# Patient Record
Sex: Male | Born: 1960
Health system: Southern US, Community
[De-identification: ages and names within clinical notes are randomized; demographics above are authoritative.]

## PROBLEM LIST (undated history)

## (undated) DIAGNOSIS — K219 Gastro-esophageal reflux disease without esophagitis: Secondary | ICD-10-CM

## (undated) DIAGNOSIS — N200 Calculus of kidney: Secondary | ICD-10-CM

## (undated) DIAGNOSIS — G4730 Sleep apnea, unspecified: Secondary | ICD-10-CM

## (undated) DIAGNOSIS — S83519A Sprain of anterior cruciate ligament of unspecified knee, initial encounter: Secondary | ICD-10-CM

## (undated) DIAGNOSIS — E039 Hypothyroidism, unspecified: Secondary | ICD-10-CM

## (undated) DIAGNOSIS — F1010 Alcohol abuse, uncomplicated: Secondary | ICD-10-CM

## (undated) DIAGNOSIS — F329 Major depressive disorder, single episode, unspecified: Secondary | ICD-10-CM

## (undated) DIAGNOSIS — E785 Hyperlipidemia, unspecified: Secondary | ICD-10-CM

## (undated) DIAGNOSIS — H269 Unspecified cataract: Secondary | ICD-10-CM

## (undated) DIAGNOSIS — F32A Depression, unspecified: Secondary | ICD-10-CM

## (undated) DIAGNOSIS — F1910 Other psychoactive substance abuse, uncomplicated: Secondary | ICD-10-CM

## (undated) DIAGNOSIS — M1990 Unspecified osteoarthritis, unspecified site: Secondary | ICD-10-CM

## (undated) DIAGNOSIS — J449 Chronic obstructive pulmonary disease, unspecified: Secondary | ICD-10-CM

## (undated) HISTORY — DX: Depression, unspecified: F32.A

## (undated) HISTORY — DX: Sprain of anterior cruciate ligament of unspecified knee, initial encounter: S83.519A

## (undated) HISTORY — DX: Chronic obstructive pulmonary disease, unspecified: J44.9

## (undated) HISTORY — PX: FINGER SURGERY: SHX640

## (undated) HISTORY — DX: Alcohol abuse, uncomplicated: F10.10

## (undated) HISTORY — PX: NASAL SINUS SURGERY: SHX719

## (undated) HISTORY — DX: Calculus of kidney: N20.0

## (undated) HISTORY — DX: Major depressive disorder, single episode, unspecified: F32.9

## (undated) HISTORY — DX: Hypothyroidism, unspecified: E03.9

## (undated) HISTORY — DX: Unspecified cataract: H26.9

## (undated) HISTORY — DX: Gastro-esophageal reflux disease without esophagitis: K21.9

## (undated) HISTORY — DX: Other psychoactive substance abuse, uncomplicated: F19.10

## (undated) HISTORY — DX: Unspecified osteoarthritis, unspecified site: M19.90

## (undated) HISTORY — DX: Hyperlipidemia, unspecified: E78.5

## (undated) HISTORY — DX: Sleep apnea, unspecified: G47.30

---

## 1999-01-12 ENCOUNTER — Ambulatory Visit (HOSPITAL_BASED_OUTPATIENT_CLINIC_OR_DEPARTMENT_OTHER): Admission: RE | Admit: 1999-01-12 | Discharge: 1999-01-12 | Payer: Self-pay | Admitting: Otolaryngology

## 2001-05-10 ENCOUNTER — Emergency Department (HOSPITAL_COMMUNITY): Admission: EM | Admit: 2001-05-10 | Discharge: 2001-05-10 | Payer: Self-pay

## 2004-10-26 ENCOUNTER — Ambulatory Visit: Payer: Self-pay | Admitting: Internal Medicine

## 2004-12-02 ENCOUNTER — Ambulatory Visit: Payer: Self-pay | Admitting: Internal Medicine

## 2005-02-01 ENCOUNTER — Ambulatory Visit: Payer: Self-pay | Admitting: Internal Medicine

## 2005-06-09 ENCOUNTER — Ambulatory Visit: Payer: Self-pay | Admitting: Internal Medicine

## 2005-06-14 ENCOUNTER — Ambulatory Visit: Payer: Self-pay | Admitting: Internal Medicine

## 2005-12-22 ENCOUNTER — Ambulatory Visit: Payer: Self-pay | Admitting: Internal Medicine

## 2006-12-25 ENCOUNTER — Ambulatory Visit: Payer: Self-pay | Admitting: Internal Medicine

## 2006-12-25 LAB — CONVERTED CEMR LAB
Albumin: 4 g/dL (ref 3.5–5.2)
Alkaline Phosphatase: 57 units/L (ref 39–117)
Basophils Absolute: 0 10*3/uL (ref 0.0–0.1)
Basophils Relative: 0.1 % (ref 0.0–1.0)
Bilirubin Urine: NEGATIVE
Cholesterol: 211 mg/dL (ref 0–200)
Eosinophils Relative: 1.7 % (ref 0.0–5.0)
GFR calc non Af Amer: 86 mL/min
Glucose, Bld: 100 mg/dL — ABNORMAL HIGH (ref 70–99)
HCT: 40.2 % (ref 39.0–52.0)
Ketones, ur: NEGATIVE mg/dL
MCHC: 35.5 g/dL (ref 30.0–36.0)
Monocytes Absolute: 0.8 10*3/uL — ABNORMAL HIGH (ref 0.2–0.7)
Neutro Abs: 6.7 10*3/uL (ref 1.4–7.7)
Neutrophils Relative %: 73.6 % (ref 43.0–77.0)
Potassium: 4.2 meq/L (ref 3.5–5.1)
Sodium: 139 meq/L (ref 135–145)
TSH: 3.7 microintl units/mL (ref 0.35–5.50)
Total Bilirubin: 0.5 mg/dL (ref 0.3–1.2)
Triglycerides: 193 mg/dL — ABNORMAL HIGH (ref 0–149)
Urine Glucose: NEGATIVE mg/dL
Urobilinogen, UA: 0.2 (ref 0.0–1.0)
VLDL: 39 mg/dL (ref 0–40)
WBC: 9.2 10*3/uL (ref 4.5–10.5)

## 2007-01-07 ENCOUNTER — Ambulatory Visit: Payer: Self-pay

## 2007-01-28 ENCOUNTER — Ambulatory Visit: Payer: Self-pay | Admitting: Cardiology

## 2007-01-28 LAB — CONVERTED CEMR LAB
BUN: 12 mg/dL (ref 6–23)
Basophils Relative: 0.9 % (ref 0.0–1.0)
CO2: 28 meq/L (ref 19–32)
Calcium: 9.2 mg/dL (ref 8.4–10.5)
Chloride: 107 meq/L (ref 96–112)
Eosinophils Absolute: 0.1 10*3/uL (ref 0.0–0.6)
Eosinophils Relative: 1.5 % (ref 0.0–5.0)
GFR calc Af Amer: 118 mL/min
GFR calc non Af Amer: 97 mL/min
HCT: 40 % (ref 39.0–52.0)
INR: 0.9 (ref 0.8–1.0)
Lymphocytes Relative: 19 % (ref 12.0–46.0)
Potassium: 3.9 meq/L (ref 3.5–5.1)
Prothrombin Time: 11.4 s (ref 10.9–13.3)
RBC: 4.34 M/uL (ref 4.22–5.81)

## 2007-02-01 ENCOUNTER — Ambulatory Visit: Payer: Self-pay | Admitting: Cardiology

## 2007-02-01 ENCOUNTER — Inpatient Hospital Stay (HOSPITAL_BASED_OUTPATIENT_CLINIC_OR_DEPARTMENT_OTHER): Admission: RE | Admit: 2007-02-01 | Discharge: 2007-02-01 | Payer: Self-pay | Admitting: Cardiology

## 2007-02-19 ENCOUNTER — Ambulatory Visit: Payer: Self-pay | Admitting: Cardiology

## 2008-04-15 ENCOUNTER — Telehealth: Payer: Self-pay | Admitting: Internal Medicine

## 2008-05-29 ENCOUNTER — Ambulatory Visit: Payer: Self-pay | Admitting: Internal Medicine

## 2008-05-29 DIAGNOSIS — E785 Hyperlipidemia, unspecified: Secondary | ICD-10-CM | POA: Insufficient documentation

## 2008-05-29 DIAGNOSIS — E039 Hypothyroidism, unspecified: Secondary | ICD-10-CM | POA: Insufficient documentation

## 2008-05-29 DIAGNOSIS — M1990 Unspecified osteoarthritis, unspecified site: Secondary | ICD-10-CM | POA: Insufficient documentation

## 2008-05-29 DIAGNOSIS — F4323 Adjustment disorder with mixed anxiety and depressed mood: Secondary | ICD-10-CM | POA: Insufficient documentation

## 2008-08-06 ENCOUNTER — Ambulatory Visit: Payer: Self-pay | Admitting: Internal Medicine

## 2008-08-06 LAB — CONVERTED CEMR LAB
AST: 18 units/L (ref 0–37)
Alkaline Phosphatase: 62 units/L (ref 39–117)
Basophils Absolute: 0 10*3/uL (ref 0.0–0.1)
Basophils Relative: 0.6 % (ref 0.0–3.0)
Bilirubin Urine: NEGATIVE
Bilirubin, Direct: 0.1 mg/dL (ref 0.0–0.3)
CO2: 31 meq/L (ref 19–32)
Calcium: 9.7 mg/dL (ref 8.4–10.5)
Chloride: 102 meq/L (ref 96–112)
Eosinophils Absolute: 0.2 10*3/uL (ref 0.0–0.7)
Eosinophils Relative: 2.1 % (ref 0.0–5.0)
HCT: 43.1 % (ref 39.0–52.0)
HDL: 46.4 mg/dL (ref >39.00)
Hemoglobin, Urine: NEGATIVE
Ketones, ur: NEGATIVE mg/dL
Leukocytes, UA: NEGATIVE
Lymphs Abs: 1.4 10*3/uL (ref 0.7–4.0)
MCHC: 33.7 g/dL (ref 30.0–36.0)
MCV: 92.6 fL (ref 78.0–100.0)
Neutrophils Relative %: 67.9 % (ref 43.0–77.0)
Nitrite: NEGATIVE
Potassium: 4.3 meq/L (ref 3.5–5.1)
RBC: 4.66 M/uL (ref 4.22–5.81)
Sodium: 140 meq/L (ref 135–145)
Total Protein: 7.1 g/dL (ref 6.0–8.3)
Urobilinogen, UA: 0.2 (ref 0.0–1.0)
VLDL: 26.4 mg/dL (ref 0.0–40.0)
pH: 7 (ref 5.0–8.0)

## 2008-08-12 ENCOUNTER — Ambulatory Visit: Payer: Self-pay | Admitting: Internal Medicine

## 2008-11-23 ENCOUNTER — Telehealth: Payer: Self-pay | Admitting: Internal Medicine

## 2008-11-23 ENCOUNTER — Ambulatory Visit (HOSPITAL_COMMUNITY): Admission: RE | Admit: 2008-11-23 | Discharge: 2008-11-23 | Payer: Self-pay | Admitting: Internal Medicine

## 2008-11-23 ENCOUNTER — Ambulatory Visit: Payer: Self-pay | Admitting: Internal Medicine

## 2008-11-23 DIAGNOSIS — J439 Emphysema, unspecified: Secondary | ICD-10-CM | POA: Insufficient documentation

## 2008-11-23 DIAGNOSIS — R079 Chest pain, unspecified: Secondary | ICD-10-CM | POA: Insufficient documentation

## 2008-11-23 DIAGNOSIS — R930 Abnormal findings on diagnostic imaging of skull and head, not elsewhere classified: Secondary | ICD-10-CM | POA: Insufficient documentation

## 2008-12-03 ENCOUNTER — Ambulatory Visit: Payer: Self-pay | Admitting: Internal Medicine

## 2009-03-10 ENCOUNTER — Ambulatory Visit: Payer: Self-pay | Admitting: Internal Medicine

## 2009-03-10 LAB — CONVERTED CEMR LAB
ALT: 27 units/L (ref 0–53)
Albumin: 4.1 g/dL (ref 3.5–5.2)
BUN: 7 mg/dL (ref 6–23)
CO2: 29 meq/L (ref 19–32)
Calcium: 9.5 mg/dL (ref 8.4–10.5)
Cholesterol: 190 mg/dL (ref 0–200)
Creatinine, Ser: 1 mg/dL (ref 0.4–1.5)
LDL Cholesterol: 118 mg/dL — ABNORMAL HIGH (ref 0–99)
Sodium: 138 meq/L (ref 135–145)
Total Bilirubin: 0.8 mg/dL (ref 0.3–1.2)
Total CHOL/HDL Ratio: 5
Total Protein: 7.3 g/dL (ref 6.0–8.3)
Triglycerides: 151 mg/dL — ABNORMAL HIGH (ref 0.0–149.0)

## 2009-03-15 ENCOUNTER — Ambulatory Visit: Payer: Self-pay | Admitting: Internal Medicine

## 2009-03-15 DIAGNOSIS — M545 Low back pain, unspecified: Secondary | ICD-10-CM | POA: Insufficient documentation

## 2009-03-15 DIAGNOSIS — N200 Calculus of kidney: Secondary | ICD-10-CM | POA: Insufficient documentation

## 2009-03-15 LAB — CONVERTED CEMR LAB
Bilirubin Urine: NEGATIVE
Ketones, ur: NEGATIVE mg/dL
Leukocytes, UA: NEGATIVE
Nitrite: NEGATIVE
Urine Glucose: NEGATIVE mg/dL

## 2009-04-07 ENCOUNTER — Telehealth: Payer: Self-pay | Admitting: Internal Medicine

## 2009-05-12 ENCOUNTER — Ambulatory Visit: Payer: Self-pay | Admitting: Family Medicine

## 2009-05-12 DIAGNOSIS — J019 Acute sinusitis, unspecified: Secondary | ICD-10-CM | POA: Insufficient documentation

## 2009-05-31 ENCOUNTER — Telehealth: Payer: Self-pay | Admitting: Internal Medicine

## 2009-06-03 ENCOUNTER — Ambulatory Visit: Payer: Self-pay | Admitting: Cardiology

## 2009-06-22 ENCOUNTER — Telehealth: Payer: Self-pay | Admitting: Internal Medicine

## 2009-08-30 ENCOUNTER — Ambulatory Visit: Payer: Self-pay | Admitting: Internal Medicine

## 2009-08-30 LAB — CONVERTED CEMR LAB
ALT: 28 units/L (ref 0–53)
AST: 28 units/L (ref 0–37)
Alkaline Phosphatase: 59 units/L (ref 39–117)
BUN: 6 mg/dL (ref 6–23)
Basophils Absolute: 0 10*3/uL (ref 0.0–0.1)
Basophils Relative: 0.4 % (ref 0.0–3.0)
Bilirubin Urine: NEGATIVE
CO2: 31 meq/L (ref 19–32)
Calcium: 9.4 mg/dL (ref 8.4–10.5)
Creatinine, Ser: 1 mg/dL (ref 0.4–1.5)
HCT: 38.6 % — ABNORMAL LOW (ref 39.0–52.0)
Hemoglobin: 13.4 g/dL (ref 13.0–17.0)
Ketones, ur: NEGATIVE mg/dL
Lymphocytes Relative: 14.1 % (ref 12.0–46.0)
Lymphs Abs: 1.1 10*3/uL (ref 0.7–4.0)
Neutrophils Relative %: 74.4 % (ref 43.0–77.0)
RBC: 4.15 M/uL — ABNORMAL LOW (ref 4.22–5.81)
Specific Gravity, Urine: 1.015 (ref 1.000–1.030)
Total Bilirubin: 0.4 mg/dL (ref 0.3–1.2)
Total CHOL/HDL Ratio: 4
Total Protein, Urine: NEGATIVE mg/dL
Triglycerides: 111 mg/dL (ref 0.0–149.0)
Urine Glucose: NEGATIVE mg/dL
WBC: 8.1 10*3/uL (ref 4.5–10.5)
pH: 7.5 (ref 5.0–8.0)

## 2009-09-06 ENCOUNTER — Ambulatory Visit: Payer: Self-pay | Admitting: Internal Medicine

## 2009-11-29 ENCOUNTER — Ambulatory Visit: Payer: Self-pay | Admitting: Internal Medicine

## 2010-02-11 ENCOUNTER — Ambulatory Visit: Payer: Self-pay | Admitting: Emergency Medicine

## 2010-02-11 DIAGNOSIS — L923 Foreign body granuloma of the skin and subcutaneous tissue: Secondary | ICD-10-CM | POA: Insufficient documentation

## 2010-03-02 ENCOUNTER — Ambulatory Visit: Payer: Self-pay | Admitting: Internal Medicine

## 2010-03-03 LAB — CONVERTED CEMR LAB
BUN: 11 mg/dL (ref 6–23)
CO2: 32 meq/L (ref 19–32)
Calcium: 9.5 mg/dL (ref 8.4–10.5)
Creatinine, Ser: 1 mg/dL (ref 0.4–1.5)
Glucose, Bld: 96 mg/dL (ref 70–99)
HDL: 42.3 mg/dL (ref >39.00)
Sodium: 139 meq/L (ref 135–145)
TSH: 6.51 microintl units/mL — ABNORMAL HIGH (ref 0.35–5.50)
Total CHOL/HDL Ratio: 4

## 2010-03-11 ENCOUNTER — Ambulatory Visit: Payer: Self-pay | Admitting: Internal Medicine

## 2010-03-11 DIAGNOSIS — J069 Acute upper respiratory infection, unspecified: Secondary | ICD-10-CM | POA: Insufficient documentation

## 2010-05-12 ENCOUNTER — Ambulatory Visit: Admit: 2010-05-12 | Payer: Self-pay | Admitting: Internal Medicine

## 2010-06-02 NOTE — Assessment & Plan Note (Signed)
Summary: CPX / NWS  #   Vital Signs:  Patient profile:   50 year old male Height:      74 inches Weight:      189 pounds BMI:     24.35 O2 Sat:      97 % on Room air Temp:     98.2 degrees F oral Pulse rate:   95 / minute BP sitting:   124 / 88  (left arm) Cuff size:   regular  Vitals Entered By: Lucious Groves (Sep 06, 2009 8:37 AM)  O2 Flow:  Room air CC: CPX./kb Is Patient Diabetic? No Pain Assessment Patient in pain? no        Primary Care Provider:  Tresa Garter MD  CC:  CPX./kb.  History of Present Illness: The patient presents for a wellness examination   Current Medications (verified): 1)  Wellbutrin Xl 300 Mg Xr24h-Tab (Bupropion Hcl) .... Once Daily 2)  Vitamin D3 1000 Unit  Tabs (Cholecalciferol) .Marland Kitchen.. 1 By Mouth Daily 3)  Simvastatin 40 Mg Tabs (Simvastatin) .... Take One Tablet Qd 4)  Mobic 15 Mg Tabs (Meloxicam) .... Once Daily 5)  Synthroid 100 Mcg Tabs (Levothyroxine Sodium) .Marland Kitchen.. 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         allergies  Physical Exam  General:  alert, well-developed, well-nourished, and cooperative to examination.    Head:  Normocephalic and atraumatic without obvious abnormalities. No apparent alopecia or balding. Eyes:  No corneal or conjunctival inflammation noted. EOMI. Perrla.  Ears:  External ear exam shows no significant lesions or deformities.  Otoscopic examination B wax, tympanic membranes are intact bilaterally without bulging, retraction, inflammation or discharge. Hearing is grossly normal bilaterally. Nose:  External nasal examination shows no deformity or inflammation. Nasal mucosa are pink and moist without lesions or exudates. Mouth:  Oral mucosa and oropharynx without lesions or exudates.  Teeth in good repair. Neck:  supple, full ROM, no masses, no JVD, no HJR, and no neck tenderness.   Lungs:  Normal respiratory effort, chest expands symmetrically. Lungs are clear to auscultation, no crackles or wheezes. Heart:  normal rate, regular rhythm, no murmur, no gallop, no rub, no heaves, and PMI normal.   Abdomen:  Bowel sounds positive,abdomen soft and non-tender without masses, organomegaly or hernias noted. Rectal:  No external abnormalities noted. Normal sphincter tone. No rectal masses or tenderness. Genitalia:  Testes bilaterally descended without nodularity, tenderness or masses. No scrotal masses or lesions. No penis lesions or urethral discharge. Prostate:  1+ enlarged.   Msk:  LS NT Pulses:  R and L carotid,radial,femoral,dorsalis pedis and posterior tibial pulses are full and equal bilaterally Extremities:  No clubbing, cyanosis, edema, or deformity noted with normal full range of motion  of all joints.   Neurologic:  No cranial nerve deficits noted. Station and gait are normal. Plantar reflexes are down-going bilaterally. DTRs are symmetrical throughout. Sensory, motor and coordinative functions appear intact. Skin:  Intact without  suspicious lesions or rashes Cervical Nodes:  No lymphadenopathy noted Inguinal Nodes:  No significant adenopathy Psych:  Cognition and judgment appear intact. Alert and cooperative with normal attention span and concentration. No apparent delusions, illusions, hallucinations   Impression & Recommendations:  Problem # 1:  PHYSICAL EXAMINATION (ICD-V70.0) Assessment New The labs were reviewed with the patient.  Health and age related issues were discussed. Available screening tests and vaccinations were discussed as well. Healthy life style including good diet and execise was discussed.   Problem # 2:  CIGARETTE SMOKER (ICD-305.1) Assessment: Unchanged  Encouraged smoking cessation and discussed different methods for smoking cessation.   Problem # 3:  EMPHYSEMA, BULLOUS (ICD-492.0) Assessment: Unchanged As per #2  Problem # 4:  HYPOTHYROIDISM (ICD-244.9) Assessment: Unchanged  The following medications were removed from the medication list:    Synthroid 100 Mcg Tabs (Levothyroxine sodium) .Marland Kitchen... 1 by mouth qd His updated medication list for this problem includes:    Synthroid 112 Mcg Tabs (Levothyroxine sodium) .Marland Kitchen... 1 by mouth qd  Problem # 5:  DEPRESSION (ICD-311) Assessment: Unchanged  His updated medication list for this problem includes:    Wellbutrin Xl 300 Mg Xr24h-tab (Bupropion hcl) ..... Once daily  Problem # 6:  HYPOTHYROIDISM (ICD-244.9) Assessment: Deteriorated  The following medications were removed from the medication list:    Synthroid 100 Mcg Tabs (Levothyroxine sodium) .Marland Kitchen... 1 by mouth qd His updated medication list for this problem includes:    Synthroid 112 Mcg Tabs (Levothyroxine sodium) .Marland Kitchen... 1 by mouth qd  Complete Medication List: 1)  Wellbutrin Xl 300 Mg Xr24h-tab (Bupropion hcl) .... Once daily 2)  Vitamin D3 1000 Unit Tabs (Cholecalciferol) .Marland Kitchen.. 1 by mouth daily 3)  Simvastatin 40 Mg Tabs (Simvastatin) .... Take one tablet qd 4)  Mobic 15 Mg  Tabs (Meloxicam) .... Once daily 5)  Synthroid 112 Mcg Tabs (Levothyroxine sodium) .Marland Kitchen.. 1 by mouth qd  Other Orders: EKG w/ Interpretation (93000)  Patient Instructions: 1)  TSH prior to visit, ICD-9:244.8 in 2 months  2)  Please schedule a follow-up appointment in 6 months. 3)  BMP prior to visit, ICD-9: 4)  Lipid Panel prior to visit, ICD-9: 5)  TSH prior to visit, ICD-9: 244.8 Prescriptions: SYNTHROID 112 MCG TABS (LEVOTHYROXINE SODIUM) 1 by mouth qd  #90 x 3   Entered and Authorized by:   Tresa Garter MD   Signed by:   Tresa Garter MD on 09/06/2009   Method used:   Electronically to        [facility] [phone]* (retail)       [address]       [place], [place]  [address]       Ph: [phone]       Fax: [phone]   RxID:   [id number]

## 2010-06-02 NOTE — Assessment & Plan Note (Signed)
Summary: 6 mos f/u #/ cd   Vital Signs:  Patient profile:   51 year old male Height:      74 inches Weight:      197 pounds BMI:     25.38 Temp:     97.0 degrees F oral Pulse rate:   68 / minute Pulse rhythm:   regular Resp:     16 per minute BP sitting:   112 / 80  (left arm) Cuff size:   regular  Vitals Entered By: Lanier Prude, CMA(AAMA) (March 11, 2010 8:59 AM) CC: 6 mo f/u Is Patient Diabetic? No Comments pt did not ever take Zpak for cat scratch.   Primary Care Provider:  Tresa Garter MD  CC:  6 mo f/u.  History of Present Illness: The patient presents for a follow up of hypothyroid, OA, hyperlipidemia  The patient presents with complaints of sore throat, fever, cough, sinus congestion and drainge of several days duration. Not better with OTC meds. Chest hurts with coughing. Can't sleep due to cough. Muscle aches are present.  The mucus is colored.   Current Medications (verified): 1)  Wellbutrin Xl 300 Mg Xr24h-Tab (Bupropion Hcl) .... Once Daily 2)  Vitamin D3 1000 Unit  Tabs (Cholecalciferol) .Marland Kitchen.. 1 By Mouth Daily 3)  Simvastatin 40 Mg Tabs (Simvastatin) .... Take One Tablet Qd 4)  Mobic 15 Mg Tabs (Meloxicam) .... Once Daily 5)  Synthroid 112 Mcg Tabs (Levothyroxine Sodium) .Marland Kitchen.. 1 By Mouth Qd 6)  Zithromax Z-Pak 250 Mg Tabs (Azithromycin) .... 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    Physical Exam  General:  alert, well-developed, well-nourished, and cooperative to examination.    Head:  Normocephalic and  atraumatic without obvious abnormalities. No apparent alopecia or balding. Mouth:  Erythematous throat and intranasal mucosa c/w URI  Neck:  supple, full ROM, no masses, no JVD, no HJR, and no neck tenderness.   Lungs:  Normal respiratory effort, chest expands symmetrically. Lungs are clear to auscultation, no crackles or wheezes. Heart:  normal rate, regular rhythm, no murmur, no gallop, no rub, no heaves, and PMI normal.   Abdomen:  Bowel sounds positive,abdomen soft and non-tender without masses, organomegaly or hernias noted. Msk:  LS NT Neurologic:  No cranial nerve deficits noted. Station and gait are normal. Plantar reflexes are down-going bilaterally. DTRs are symmetrical throughout. Sensory, motor and coordinative functions appear intact. Skin:  Intact without suspicious lesions or rashes Psych:  Cognition and judgment appear intact. Alert and cooperative with normal attention span and concentration. No apparent delusions, illusions, hallucinations   Impression & Recommendations:  Problem # 1:  HYPOTHYROIDISM (ICD-244.9) Assessment Deteriorated  The following medications were removed from the medication list:    Synthroid 112 Mcg Tabs (Levothyroxine sodium) .Marland Kitchen... 1 by mouth qd His updated medication list for this problem includes:    Synthroid 137 Mcg Tabs (Levothyroxine sodium) .Marland Kitchen... 1 by mouth qd  Problem # 2:  CIGARETTE SMOKER (ICD-305.1) Assessment: Unchanged  Encouraged smoking cessation and discussed different methods for smoking cessation.   Problem # 3:  DEPRESSION (ICD-311) Assessment: Unchanged  His updated medication list  for this problem includes:    Wellbutrin Xl 300 Mg Xr24h-tab (Bupropion hcl) ..... Once daily  Problem # 4:  UPPER RESPIRATORY INFECTION, ACUTE (ICD-465.9) Assessment: New On the regimen of medicine(s) reflected in the chart    Complete Medication List: 1)  Wellbutrin Xl 300 Mg Xr24h-tab (Bupropion hcl) .... Once daily 2)  Vitamin D3 1000  Unit Tabs (Cholecalciferol) .Marland Kitchen.. 1 by mouth daily 3)  Simvastatin 40 Mg Tabs (Simvastatin) .... Take one tablet qd 4)  Mobic 15 Mg Tabs (Meloxicam) .... Once daily 5)  Zithromax Z-pak 250 Mg Tabs (Azithromycin) .... Use as directed 6)  Synthroid 137 Mcg Tabs (Levothyroxine sodium) .Marland Kitchen.. 1 by mouth qd  Other Orders: Admin 1st Vaccine (16109) Flu Vaccine 74yrs + (60454)  Patient Instructions: 1)  Please schedule a follow-up appointment in 3 months. 2)  TSH prior to visit, ICD-9:244.8 Prescriptions: ZITHROMAX Z-PAK 250 MG TABS (AZITHROMYCIN) use as directed  #1 x 0   Entered and Authorized by:   Tresa Garter MD   Signed by:   Tresa Garter MD on 03/11/2010   Method used:   Print then Give to Patient   RxID:   [id number] SYNTHROID 137 MCG TABS (LEVOTHYROXINE SODIUM) 1 by mouth qd  #90 x 3   Entered and Authorized by:   Tresa Garter MD   Signed by:   Tresa Garter MD on 03/11/2010   Method used:   Print then Give to Patient   RxID:   [id number]    Orders Added: 1)  Est. Patient Level IV [41324] 2)  Admin 1st Vaccine [40102] 3)  Flu Vaccine 35yrs + [72536]    Influenza Vaccine (to be given today) Flu Vaccine Consent Questions     Do you have a history of severe allergic reactions to this vaccine? no    Any prior history of allergic reactions to egg and/or gelatin? no    Do you have a sensitivity to the preservative Thimersol? no    Do you have a past history of Guillan-Barre Syndrome? no    Do you currently have an acute febrile illness? no    Have you ever had a severe reaction to latex? no    Vaccine information given and explained to patient? yes    Are you currently pregnant? no    Lot Number:[id number]   Exp Date:10/29/2010   Site Given  Left Deltoid IM Lanier Prude, [facility])  March 11, 2010 9:33 AM

## 2010-06-02 NOTE — Progress Notes (Signed)
  Phone Note Refill Request Message from:  Fax from Pharmacy on June 22, 2009 3:58 PM  Refills Requested: Medication #1:  WELLBUTRIN XL 300 MG XR24H-TAB once daily Initial call taken by: Rock Nephew CMA,  June 22, 2009 3:58 PM    Prescriptions: WELLBUTRIN XL 300 MG XR24H-TAB (BUPROPION HCL) once daily  #90 x 3   Entered by:   Rock Nephew CMA   Authorized by:   Tresa Garter MD   Signed by:   Rock Nephew CMA on 06/22/2009   Method used:   Electronically to        [facility]* (mail-order)             ,          Ph: [phone]       Fax: [phone]   RxID:   [id number]

## 2010-06-02 NOTE — Progress Notes (Signed)
Summary: arrange ct chest f/u  Phone Note Outgoing Call   Summary of Call: pt is due for his 6 mo CT chest followup for ?small nodule seen in left lung in July 2010 scan -  please let pt know we will arrange for this now and he is to expect call from [facility] once this is scheduled - thanks Initial call taken by: Newt Lukes MD,  May 31, 2009 9:20 AM  Follow-up for Phone Call        pt informed Follow-up by: Margaret Pyle, CMA,  May 31, 2009 10:23 AM

## 2010-06-02 NOTE — Assessment & Plan Note (Signed)
Summary: runny nose-yellowish, sinus pressure x 2 1/2 weeks rm 3   Vital Signs:  Patient Profile:   50 Years Old Male CC:      Cold & URI symptoms Height:     74 inches (187.96 cm) Weight:      183 pounds O2 Sat:      100 % O2 treatment:    Room Air Temp:     97.5 degrees F oral Pulse rate:   88 / minute Pulse rhythm:   regular Resp:     16 per minute BP sitting:   140 / 96  (right arm) Cuff size:   regular  Vitals Entered By: Areta Haber CMA (May 12, 2009 5:34 PM)                  Prior Medication List:  WELLBUTRIN XL 300 MG XR24H-TAB (BUPROPION HCL) once daily VITAMIN D3 1000 UNIT  TABS (CHOLECALCIFEROL) 1 by mouth daily SIMVASTATIN 40 MG TABS (SIMVASTATIN) Take one tablet qd MOBIC 15 MG TABS (MELOXICAM) once daily HYDROCODONE-ACETAMINOPHEN 10-325 MG TABS (HYDROCODONE-ACETAMINOPHEN) 1 by mouth up to 4 time per day as needed for pain SYNTHROID 100 MCG TABS (LEVOTHYROXINE SODIUM) 1 by mouth qd   Current Allergies: No known allergies History of Present Illness Chief Complaint: Cold & URI symptoms History of Present Illness: Patient w/ sinus infescton for about 2.5 weeks before Christmas. Usually once every 12-18 months. Report yellow-greenish and real thick. No fever.  Hx of 2 sinus surgeres before.   Current Problems: ACUTE SINUSITIS, UNSPECIFIED (ICD-461.9) NEPHROLITHIASIS (ICD-592.0) LOW BACK PAIN (ICD-724.2) CIGARETTE SMOKER (ICD-305.1) EMPHYSEMA, BULLOUS (ICD-492.0) CT, CHEST, ABNORMAL (ICD-793.1) CHEST PAIN, ACUTE (ICD-786.50) WELL ADULT EXAM (ICD-V70.0) DEPRESSION (ICD-311) OSTEOARTHRITIS (ICD-715.90) HYPOTHYROIDISM (ICD-244.9) HYPERLIPIDEMIA (ICD-272.4)   Current Meds WELLBUTRIN XL 300 MG XR24H-TAB (BUPROPION HCL) once daily VITAMIN D3 1000 UNIT  TABS (CHOLECALCIFEROL) 1 by mouth daily SIMVASTATIN 40 MG TABS (SIMVASTATIN) Take one tablet qd MOBIC 15 MG TABS (MELOXICAM) once daily HYDROCODONE-ACETAMINOPHEN 10-325 MG TABS  (HYDROCODONE-ACETAMINOPHEN) 1 by mouth up to 4 time per day as needed for pain SYNTHROID 100 MCG TABS (LEVOTHYROXINE SODIUM) 1 by mouth qd AUGMENTIN 875-125 MG TABS (AMOXICILLIN-POT CLAVULANATE) 1 by mouth 2 times daily * NASONEX 50 MCG/ACT SUSP (MOMETASONE FUROATE)/OR FLONASE 1-2 each nostril qd * ALLEGRA-D 24 HOUR 180-240 MG XR24H-TAB/OR CLARTN -D 24 HOUR 1 poq day  REVIEW OF SYSTEMS Constitutional Symptoms      Denies fever, chills, night sweats, weight loss, weight gain, and fatigue.  Eyes       Denies change in vision, eye pain, eye discharge, glasses, contact lenses, and eye surgery. Ear/Nose/Throat/Mouth       Complains of frequent runny nose and sinus problems.      Denies hearing loss/aids, change in hearing, ear pain, ear discharge, dizziness, frequent nose bleeds, sore throat, hoarseness, and tooth pain or bleeding.      Comments: x 2 1/2 weeks Respiratory       Denies dry cough, productive cough, wheezing, shortness of breath, asthma, bronchitis, and emphysema/COPD.  Cardiovascular       Denies murmurs, chest pain, and tires easily with exhertion.    Gastrointestinal       Denies stomach pain, nausea/vomiting, diarrhea, constipation, blood in bowel movements, and indigestion. Genitourniary       Denies painful urination, kidney stones, and loss of urinary control. Neurological       Denies paralysis, seizures, and fainting/blackouts. Musculoskeletal       Denies muscle pain, joint  pain, joint stiffness, decreased range of motion, redness, swelling, muscle weakness, and gout.  Skin       Denies bruising, unusual mles/lumps or sores, and hair/skin or nail changes.  Psych       Denies mood changes, temper/anger issues, anxiety/stress, speech problems, depression, and sleep problems. Other Comments: pt has not seen PCP for this.   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 No CAD  Social History: Reviewed history from 11/23/2008 and no changes required. 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  Plan New Medications/Changes: ALLEGRA-D 24 HOUR 180-240 MG XR24H-TAB/OR CLARTN -D 24 HOUR 1 poq day  #20 x 0, 05/12/2009, Hassan Rowan MD NASONEX 50 MCG/ACT SUSP (MOMETASONE FUROATE)/OR FLONASE 1-2 each nostril qd  #1 x 0, 05/12/2009, Hassan Rowan MD AUGMENTIN [phone] MG TABS (AMOXICILLIN-POT CLAVULANATE) 1 by mouth 2 times daily  #28 x 0, 05/12/2009, Hassan Rowan MD  New Orders: New Patient Level III [phone] Planning Comments:   as below  Follow Up: Follow up in 2-3 days if no improvement, Follow up on an as needed basis, Follow up with Primary Physician  The patient and/or caregiver has been counseled thoroughly with regard to medications prescribed including dosage, schedule, interactions, rationale for use, and possible side effects and they  verbalize understanding.  Diagnoses and expected course of recovery discussed and will return if not improved as expected or if the condition worsens. Patient and/or caregiver verbalized understanding.  Prescriptions: ALLEGRA-D 24 HOUR 180-240 MG XR24H-TAB/OR CLARTN -D 24 HOUR 1 poq day  #20 x 0   Entered and Authorized by:   Hassan Rowan MD   Signed by:   Hassan Rowan MD on 05/12/2009   Method used:   Printed then faxed to ...       [facility] [phone]* (retail)       [address] [place], [place]  [address]       Ph: [phone]       Fax: [phone]   RxID:   [id number] NASONEX 50 MCG/ACT SUSP (MOMETASONE FUROATE)/OR FLONASE 1-2 each nostril qd  #1 x 0   Entered and Authorized by:   Hassan Rowan MD   Signed by:   Hassan Rowan MD on 05/12/2009   Method used:   Printed then faxed to ...       [facility] [phone]* (retail)       [address] [place], [place]  [address]       Ph: [phone]  Fax: [phone]   RxID:   [id number] AUGMENTIN 875-125 MG TABS (AMOXICILLIN-POT CLAVULANATE) 1 by mouth 2 times daily  #28 x 0   Entered and Authorized by:   Hassan Rowan MD   Signed by:   Hassan Rowan MD on 05/12/2009   Method used:   Printed then faxed to ...       [facility] [phone]* (retail)       [address] [place], [place]  [address]       Ph: [phone]       Fax: [phone]   RxID:   [id number]   Patient Instructions: 1)  Please schedule a follow-up appointment as needed. 2)  Please schedule an appointment with your primary doctor in :5-7 days f not better 3)  Tobacco is very bad for your health and your loved ones! You Should stop smoking!. 4)  Stop Smoking Tips: Choose a Quit date. Cut down before the Quit date. decide what you will do as a substitute when you feel the urge to smoke(gum,toothpick,exercise). 5)  Take your antibiotic as prescribed until ALL of it is gone, but stop if you develop a rash or swelling and  contact our office as soon as possible. 6)  Acute sinusitis symptoms for less than 10 days are not helped by antibiotics.Use warm moist compresses, and over the counter decongestants ( only as directed). Call if no improvement in 5-7 days, sooner if increasing pain, fever, or new symptoms.

## 2010-06-02 NOTE — Assessment & Plan Note (Signed)
Summary: CAT SCRATCH UNDER RIGHT EYE/TJ   Vital Signs:  Patient Profile:   50 Years Old Male CC:      Cat scratch - under R eye x 2dys Height:     74 inches (187.96 cm) Weight:      199 pounds O2 Sat:      100 % O2 treatment:    Room Air Temp:     98.7 degrees F oral Pulse rate:   93 / minute Pulse rhythm:   regular Resp:     16 per minute BP sitting:   122 / 83  (left arm) Cuff size:   regular  Vitals Entered By: Areta Haber CMA (February 11, 2010 6:10 PM)                  Current Allergies: No known allergies History of Present Illness Chief Complaint: Cat scratch - under R eye x 2dys History of Present Illness: He was scratched by his own cat 2 days ago under the R eye.  He feels that part of the claw is still stuck in it.  Mild tenderness and a feeling that there is something there.  Redness only from the scratch, but none surrounding.  No visual problems, no swelling. No F/C/N/V.  No other symptoms.  He is concerned with infection.  He reports being UTD on his Td.  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) CT, CHEST, ABNORMAL (ICD-793.1) CHEST PAIN, ACUTE (ICD-786.50) WELL ADULT EXAM (ICD-V70.0) DEPRESSION (ICD-311) OSTEOARTHRITIS (ICD-715.90) HYPOTHYROIDISM (ICD-244.9) HYPERLIPIDEMIA (ICD-272.4)   Current Meds WELLBUTRIN XL 300 MG XR24H-TAB (BUPROPION HCL) once daily VITAMIN D3 1000 UNIT  TABS (CHOLECALCIFEROL) 1 by mouth daily SIMVASTATIN 40 MG TABS (SIMVASTATIN) Take one tablet qd MOBIC 15 MG TABS (MELOXICAM) once daily SYNTHROID 112 MCG TABS (LEVOTHYROXINE SODIUM) 1 by mouth qd ZITHROMAX Z-PAK 250 MG TABS (AZITHROMYCIN) use as directed  REVIEW OF SYSTEMS Constitutional Symptoms      Denies fever, chills, night sweats, weight  loss, weight gain, and fatigue.  Eyes       Denies change in vision, eye pain, eye discharge, glasses, contact lenses, and eye surgery. Ear/Nose/Throat/Mouth       Denies hearing loss/aids, change in hearing, ear pain, ear discharge, dizziness, frequent runny nose, frequent nose bleeds, sinus problems, sore throat, hoarseness, and tooth pain or bleeding.  Respiratory       Denies dry cough, productive cough, wheezing, shortness of breath, asthma, bronchitis, and emphysema/COPD.  Cardiovascular       Denies murmurs, chest pain, and tires easily with exhertion.    Gastrointestinal       Denies stomach pain, nausea/vomiting, diarrhea, constipation, blood in bowel movements, and indigestion. Genitourniary       Denies painful urination, kidney stones, and loss of urinary control. Neurological       Denies paralysis, seizures, and fainting/blackouts. Musculoskeletal       Denies muscle pain, joint pain, joint stiffness, decreased range of motion, redness, swelling, muscle weakness, and gout.  Skin       Denies bruising, unusual mles/lumps or sores, and hair/skin or nail changes.      Comments: Cat scratch  - under r eye x 2dys Psych       Denies mood changes, temper/anger issues, anxiety/stress, speech problems, depression, and sleep problems.  Past History:  Past Medical History: Last updated: 03/15/2009 Hyperlipidemia Hypothyroidism Osteoarthritis  B ACL partial tear Dr Wyline Mood Depression Heart cath nl 2009 H/o kidney stones  Past Surgical History: Last updated: 09/06/2009 Denies surgical history  Family History: Last updated: 05/29/2008 No CAD  Social History: Last updated: 11/23/2008 Occupation: Engineer, materials Married, wife ill Current Smoker Drug use-no  Risk Factors: Smoking Status: current (03/15/2009) Packs/Day: 1.0 (03/15/2009) Physical Exam General appearance: well developed, well nourished, no acute distress Eyes: conjunctivae and lids  normal Pupils: equal, round, reactive to light MSE: oriented to time, place, and person Inferior to R eye is a 3cm superficial horizontal scratch.  No eye or eyelid involvement.  No bleeding, no drainage, no pus, no signs of infection, no swelling.  A small object on the lateral end of the scratch is present. Assessment New Problems: FOREIGN BODY GRANULOMA SKIN&SUBCUTANEOUS TISSUE (ICD-709.4)   Plan New Medications/Changes: ZITHROMAX Z-PAK 250 MG TABS (AZITHROMYCIN) use as directed  #1 x 0, 02/11/2010, Hoyt Koch MD  New Orders: Est. Patient Level III [phone] Incision and Removal of Foreign Body, subq tissues; simple [10120] Planning Comments:   The wound doesn't appear to be infected at this point.  I gave a Rx for Zpak which he will hold. If any signs of infection, start and finish the meds.  Keep wound clean and dry until it heals up.    The patient and/or caregiver has been counseled thoroughly with regard to medications prescribed including dosage, schedule, interactions, rationale for use, and possible side effects and they verbalize understanding.  Diagnoses and expected course of recovery discussed and will return if not improved as expected or if the condition worsens. Patient and/or caregiver verbalized understanding.   PROCEDURE: Procedure: Area was cleaned with hibaclens and saline.  Then the object was pulled out using sterile tweezers.  It appeared to be about 4mm of a distal cat's claw.  The area was recleaned, covered with antibiotic ointment, and the patient discharged home. Prescriptions: ZITHROMAX Z-PAK 250 MG TABS (AZITHROMYCIN) use as directed  #1 x 0   Entered and Authorized by:   Hoyt Koch MD   Signed by:   Hoyt Koch MD on 02/11/2010   Method used:   Print then Give to Patient   RxID:   [id number]   Orders Added: 1)  Est. Patient Level III [32440] 2)  Incision and Removal of Foreign Body, subq tissues; simple [10120]

## 2010-07-27 ENCOUNTER — Other Ambulatory Visit: Payer: BC Managed Care – PPO

## 2010-07-27 ENCOUNTER — Other Ambulatory Visit (INDEPENDENT_AMBULATORY_CARE_PROVIDER_SITE_OTHER): Payer: BC Managed Care – PPO

## 2010-07-27 ENCOUNTER — Other Ambulatory Visit: Payer: Self-pay | Admitting: Internal Medicine

## 2010-07-27 DIAGNOSIS — E039 Hypothyroidism, unspecified: Secondary | ICD-10-CM

## 2010-07-27 LAB — TSH: TSH: 1.02 u[IU]/mL (ref 0.35–5.50)

## 2010-09-13 NOTE — Cardiovascular Report (Signed)
[name] [name], [name] [name]              ACCOUNT NO.:  [id number]   MEDICAL RECORD NO.:  [id number]          PATIENT TYPE:  OIB   LOCATION:  1963                         FACILITY:  [facility]   PHYSICIAN:  Rollene Rotunda, MD, FACCDATE OF BIRTH:  December 14, 1960   DATE OF PROCEDURE:  02/01/2007  DATE OF DISCHARGE:                            CARDIAC CATHETERIZATION   PROCEDURE:  Left heart catheterization with coronary arteriography.   INDICATIONS:  Evaluate patient with abnormal Cardiolite suggesting  inferior ischemia.  He had chest pain and cardiovascular risk factors.   PROCEDURE NOTE:  Left heart catheterization performed via the right  femoral artery.  The artery was cannulated using anterior wall puncture.  A #4-French arterial sheath was inserted via the modified Seldinger  technique.  Preformed Judkins and pigtail catheter were utilized.  The  patient tolerated the procedure well and left the lab in stable  condition.   HEMODYNAMICS:  LV 120/7; AO 120/98.   Coronaries:  Left main was normal.  The LAD was normal.  There was a mid  diagonal which was branching and normal.  The circumflex was essentially  a large branching ramus intermediate which was normal.  The right  coronary artery was dominant.  It was normal throughout its course.  PDA  was moderate size and normal.  Posterolateral x2 with moderate size and  normal.   Left ventriculogram:  The left ventriculogram was obtained in the RAO  projection.  The EF was 55% with normal wall motion.   CONCLUSION:  1. Normal coronaries.  2. Normal left ventricle ejection fraction.   PLAN:  The patient will continue to have medical management and primary  risk reduction.      Rollene Rotunda, MD, [facility]  Electronically Signed     JH/MEDQ  D:  02/01/2007  T:  02/02/2007  Job:  [id number]   cc:   Georgina Quint. Plotnikov, MD

## 2010-09-13 NOTE — Assessment & Plan Note (Signed)
[facility]                            CARDIOLOGY OFFICE NOTE   NAME:[name] [name], [name] [name]                     MRN:          [id number]  DATE:02/19/2007                            DOB:          1961/02/03    PRIMARY CARE PHYSICIAN:  Dr. Macarthur Critchley Plotnikov.   REASON FOR PRESENTATION:  Evaluate patient status post recent cardiac  catheterization.   HISTORY OF PRESENT ILLNESS:  The patient presents for evaluation after  cardiac catheterization.  This was done to evaluate chest discomfort,  and a Cardiolite suggesting inferior ischemia.  However, cardiac  catheterization demonstrated normal coronary arteries, and a well-  preserved ejection fraction.   He returns for followup.  He has had no problems with his groin, other  than some very slight ecchymosis.  This is resolving.  He is having no  chest pressure, neck or arm discomfort, or shortness of breath.  The  previous symptoms seem to have resolved somewhat.   PAST MEDICAL HISTORY:  1. Hypothyroidism.  2. Hyperlipidemia.  3. Questionable asthma.  4. Sinus surgery.  5. Traumatic finger amputation.   ALLERGIES:  NONE.   MEDICATIONS:  1. Wellbutrin XL 300 mg daily.  2. Synthroid 75 mcg daily.  3. Niacin 500 mg nightly.  4. Aspirin 81 mg daily.   REVIEW OF SYSTEMS:  As stated in the HPI, and otherwise negative for  other systems.   PHYSICAL EXAMINATION:  The patient is in no distress.  Blood pressure 118/70.  Heart rate 100 and regular.  Weight 196 pounds.  HEENT:  Eyes unremarkable.  Pupils equal, round, and reactive light.  Fundi not visualized.  NECK:  No jugular venous distention at 45 degrees.  Carotid upstroke  brisk and symmetric.  No bruits.  No thyromegaly.  LYMPHATICS:  No adenopathy.  LUNGS:  Clear to auscultation bilaterally.  CHEST:  Unremarkable.  HEART:  PMI not displaced or sustained.  S1 and S2 within normal limits.  No S3.  No S4.  No clicks.  No rubs.  No murmurs.  ABDOMEN:  Mildly obese.  Positive bowel sounds.  Normal in frequency and  pitch.  No bruits.  No rebound.  No guarding.  No midline pulsatile  mass.  No organomegaly.  SKIN:  No rashes.  No nodules.  EXTREMITIES:  Two plus pulses.  No edema.   ASSESSMENT AND PLAN:  1. Chest discomfort.  The patient's chest discomfort is not related to      angina.  At this point, I do not see another cardiac etiology.      Therefore, no further Cardiovascular testing is suggested.  I have      asked him to follow up with Dr. Posey Rea to evaluate nonanginal      causes.  2. Dyslipidemia.  We talked about this at length.  He is going to      continue with his Niacin, and diet, and follow with Dr. Posey Rea.  3. Followup.  We will see him back as needed.     Rollene Rotunda, MD, [facility]  Electronically Signed    JH/MedQ  DD: 02/19/2007  DT: 02/20/2007  Job #: [id number]   cc:   Georgina Quint. Plotnikov, MD

## 2010-09-13 NOTE — Assessment & Plan Note (Signed)
[facility]                            CARDIOLOGY OFFICE NOTE   NAME:[name] [name] BATTA                     MRN:          [id number]  DATE:01/28/2007                            DOB:          [birth year]    REASON FOR PRESENTATION:  Evaluate patient with abnormal cardiovascular  study and chest pain.   HISTORY OF PRESENT ILLNESS:  The patient was referred for evaluation of  the above. He is 50 years old. He is not having prior cardiac history  though he reports a stress test about 6 years ago. I do not have these  results. He does have significant cardiovascular risk factors. Over the  last three months, he has had chest discomfort. This has been somewhat  atypical. It is sharp. It is on the left side. It last for only a few  seconds. It comes on sporadically, though it had been increasing in  intensity and frequency. It is so brief that he does not do anything to  try to get rid of it. He does not notice that he can bring it on. He  does not have any associated nausea, vomiting or shortness of breath. He  does have significant diaphoresis that he has noted. This is distinctly  unusual for him. It happens with activities. He thinks his exercise  tolerance is somewhat reasonable and not necessarily unchanged.   He was sent for a stress perfusion study by Dr.  Posey Rea. This  demonstrated that he had some chest discomfort with activity. He  exercised for 7 minutes and 30 seconds. There were non-diagnostic ST  changes. However, there was mild inferior wall ischemia. His ejection  fraction was about 57%. This was read as a positive study.   PAST MEDICAL HISTORY:  1. Hypothyroidism.  2. Hyperlipidemia x5 years.  3. Questionable asthma.   PAST SURGICAL HISTORY:  1. Sinus surgery.  2. Traumatic finger amputation.   ALLERGIES:  None.   MEDICATIONS:  1. Synthroid 75 mcg daily.  2. Wellbutrin 300 mg daily.  3. Niacin 500 mg daily.  4. Naprosyn.  5.  Aspirin 81 mg daily.   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 He has no children. He has been smoking greater than a half  pack per day for 28 years. He is currently down to a half pack per day.   FAMILY HISTORY:  He does not know his father's history. He thinks he is  still alive. He has one older brother who has no cardiac problems. His  mother has hypertension.   REVIEW OF SYSTEMS:  As stated in the HPI and positive for reading  glasses, occasional leg swelling and negative for other systems.   PHYSICAL EXAMINATION:  The patient is in no distress. Blood pressure  150/82, heart rate 90 and regular, afebrile, weight 197 pounds. Body  Mass Index is 25.  HEENT: Eyelids unremarkable. Pupils equal, round, and reactive to light.  Fundi not visualized. Oral mucosa is unremarkable.  NECK: No jugular venous distention at 45 degrees.  Carotid upstroke  brisk and symmetrical. No  bruits, no thyromegaly.  LYMPHATICS: No cervical, axillary or inguinal adenopathy.  LUNGS: Clear to auscultation bilaterally.  BACK: No costovertebral angle tenderness.  CHEST: Unremarkable.  HEART: PMI not displaced or sustained. S1, S2 within normal limits. No  S3. No S4. No clicks, rub or murmurs.  ABDOMEN: Flat, positive bowel sounds, normal in frequency and pitch. No  bruits. No rebounds. No guarding. No midline pulsatile mass. No  hepatomegaly or splenomegaly.  SKIN: No rashes, no nodules.  EXTREMITIES: 2+ pulses throughout. No edema, cyanosis or clubbing.  NEURO: Oriented to person, place and time. Cranial nerves II-XII grossly  intact. Motor grossly intact.   ASSESSMENT/PLAN:  1. Abnormal Cardiolite. The patient does have an abnormal Cardiolite,      significant cardiovascular risk factors. He does have some symptoms      that are worrisome for possible ischemia. Given this, the pre-test      probability of obstructive coronary disease is high. The next step       should be a cardiac catheterization. I have discussed extensively      the risks, benefits to include stroke, death, myocardial      infarction, dye allergy, renal insufficiency, infection, vascular      trauma, embolism, bleeding and bruising. He understands and agrees      to proceed.  2. Hypertension. His blood pressure is high today, but not typically.      Will watch this and this can be treated as needed.  3. Tobacco. He says he does not have the desire to quit smoking.  4. Followup. Will see the patient back again at time of      catheterization.     Rollene Rotunda, MD, [facility]  Electronically Signed    JH/MedQ  DD: 01/28/2007  DT: 01/28/2007  Job #: [id number]   cc:   Georgina Quint. Plotnikov, MD

## 2010-09-15 ENCOUNTER — Inpatient Hospital Stay (INDEPENDENT_AMBULATORY_CARE_PROVIDER_SITE_OTHER)
Admission: RE | Admit: 2010-09-15 | Discharge: 2010-09-15 | Disposition: A | Discharge status: Home / Self Care | Payer: BC Managed Care – PPO | Source: Ambulatory Visit | Attending: Emergency Medicine | Admitting: Emergency Medicine

## 2010-09-15 ENCOUNTER — Encounter: Payer: Self-pay | Admitting: Emergency Medicine

## 2010-09-15 DIAGNOSIS — J069 Acute upper respiratory infection, unspecified: Secondary | ICD-10-CM

## 2010-11-18 ENCOUNTER — Other Ambulatory Visit: Payer: Self-pay | Admitting: Internal Medicine

## 2011-01-04 ENCOUNTER — Ambulatory Visit (INDEPENDENT_AMBULATORY_CARE_PROVIDER_SITE_OTHER): Payer: BC Managed Care – PPO | Admitting: Internal Medicine

## 2011-01-04 ENCOUNTER — Encounter: Payer: Self-pay | Admitting: Internal Medicine

## 2011-01-04 ENCOUNTER — Other Ambulatory Visit (INDEPENDENT_AMBULATORY_CARE_PROVIDER_SITE_OTHER): Payer: BC Managed Care – PPO

## 2011-01-04 DIAGNOSIS — E785 Hyperlipidemia, unspecified: Secondary | ICD-10-CM

## 2011-01-04 DIAGNOSIS — F329 Major depressive disorder, single episode, unspecified: Secondary | ICD-10-CM

## 2011-01-04 DIAGNOSIS — F3289 Other specified depressive episodes: Secondary | ICD-10-CM

## 2011-01-04 DIAGNOSIS — Z23 Encounter for immunization: Secondary | ICD-10-CM

## 2011-01-04 DIAGNOSIS — J439 Emphysema, unspecified: Secondary | ICD-10-CM

## 2011-01-04 DIAGNOSIS — R1310 Dysphagia, unspecified: Secondary | ICD-10-CM

## 2011-01-04 DIAGNOSIS — E039 Hypothyroidism, unspecified: Secondary | ICD-10-CM

## 2011-01-04 DIAGNOSIS — M545 Low back pain, unspecified: Secondary | ICD-10-CM

## 2011-01-04 LAB — CBC WITH DIFFERENTIAL/PLATELET
Basophils Absolute: 0 10*3/uL (ref 0.0–0.1)
Eosinophils Absolute: 0.1 10*3/uL (ref 0.0–0.7)
Lymphocytes Relative: 11 % — ABNORMAL LOW (ref 12.0–46.0)
MCHC: 33.2 g/dL (ref 30.0–36.0)
Monocytes Relative: 6.3 % (ref 3.0–12.0)
Neutrophils Relative %: 81.2 % — ABNORMAL HIGH (ref 43.0–77.0)
Platelets: 208 10*3/uL (ref 150.0–400.0)
RDW: 13.2 % (ref 11.5–14.6)

## 2011-01-04 MED ORDER — OMEPRAZOLE 40 MG PO CPDR: 40.0000 mg | DELAYED_RELEASE_CAPSULE | Freq: Every day | ORAL | Status: DC

## 2011-01-04 NOTE — Progress Notes (Signed)
  Subjective:    Patient ID: [name] [name], male    DOB: 12/05/1960, 50 y.o.   MRN: [id number]  HPI  The patient presents for a follow-up of  chronic hypertension, chronic dyslipidemia, hypothyroidism controlled with medicines. C/o some foods get stuck in throat - rice and fried potatos. C/o cough.    Review of Systems  Constitutional: Positive for unexpected weight change (10 lbs on diet). Negative for appetite change and fatigue.  HENT: Negative for nosebleeds, congestion, sore throat, sneezing, trouble swallowing and neck pain.   Eyes: Negative for itching and visual disturbance.  Respiratory: Positive for cough.   Cardiovascular: Negative for chest pain, palpitations and leg swelling.  Gastrointestinal: Negative for nausea, diarrhea, blood in stool and abdominal distention.  Genitourinary: Negative for frequency and hematuria.  Musculoskeletal: Negative for back pain, joint swelling and gait problem.  Skin: Negative for rash.  Neurological: Negative for dizziness, tremors, speech difficulty and weakness.  Psychiatric/Behavioral: Negative for sleep disturbance, dysphoric mood and agitation. The patient is not nervous/anxious.        Objective:   Physical Exam  Constitutional: He is oriented to person, place, and time. He appears well-developed.  HENT:  Mouth/Throat: Oropharynx is clear and moist.  Eyes: Conjunctivae are normal. Pupils are equal, round, and reactive to light.  Neck: Normal range of motion. No JVD present. No thyromegaly present.  Cardiovascular: Normal rate, regular rhythm, normal heart sounds and intact distal pulses.  Exam reveals no gallop and no friction rub.   No murmur heard. Pulmonary/Chest: Effort normal and breath sounds normal. No respiratory distress. He has no wheezes. He has no rales. He exhibits no tenderness.  Abdominal: Soft. Bowel sounds are normal. He exhibits no distension and no mass. There is no tenderness. There is no rebound and no  guarding.  Musculoskeletal: Normal range of motion. He exhibits no edema and no tenderness.  Lymphadenopathy:    He has no cervical adenopathy.  Neurological: He is alert and oriented to person, place, and time. He has normal reflexes. No cranial nerve deficit. He exhibits normal muscle tone. Coordination normal.  Skin: Skin is warm and dry. No rash noted.  Psychiatric: He has a normal mood and affect. His behavior is normal. Judgment and thought content normal.          Assessment & Plan:

## 2011-01-04 NOTE — Assessment & Plan Note (Signed)
Continue with current prescription therapy as reflected on the Med list.  

## 2011-01-04 NOTE — Assessment & Plan Note (Signed)
Needs to quit smoking 

## 2011-01-04 NOTE — Assessment & Plan Note (Signed)
GI consult Start Prilosec

## 2011-01-04 NOTE — Assessment & Plan Note (Signed)
Doing well now 

## 2011-01-10 ENCOUNTER — Encounter: Payer: Self-pay | Admitting: Gastroenterology

## 2011-02-07 ENCOUNTER — Ambulatory Visit: Payer: BC Managed Care – PPO | Admitting: Gastroenterology

## 2011-02-13 ENCOUNTER — Ambulatory Visit: Payer: BC Managed Care – PPO | Admitting: Gastroenterology

## 2011-02-20 ENCOUNTER — Other Ambulatory Visit: Payer: Self-pay | Admitting: Internal Medicine

## 2011-03-07 ENCOUNTER — Encounter: Payer: Self-pay | Admitting: Gastroenterology

## 2011-03-07 ENCOUNTER — Ambulatory Visit (INDEPENDENT_AMBULATORY_CARE_PROVIDER_SITE_OTHER): Payer: BC Managed Care – PPO | Admitting: Gastroenterology

## 2011-03-07 DIAGNOSIS — R1310 Dysphagia, unspecified: Secondary | ICD-10-CM

## 2011-03-07 NOTE — Patient Instructions (Addendum)
One of your biggest health concerns is your smoking.  This increases your risk for most cancers and serious cardiovascular diseases such as strokes, heart attacks.  You should try your best to stop.  If you need assistance, please contact your PCP or Smoking Cessation Class at [facility] [phone]) or [facility] Quit-Line (1-800-QUIT-NOW). Stop the prilosec, it doesn't help your symptoms anyway. You will be set up for an upper endoscopy. A copy of this information will be made available to Dr. Posey Rea.

## 2011-03-07 NOTE — Progress Notes (Signed)
HPI: This is a  very pleasant 50 year old man who I am meeting for the first time  Has problems with "certain types of rice."  Rice at home causes no problems.  Sonic Tater Tots cause no problems.  Tater tots at home are fine.  No problems with other foods, pills. These swallowing troubles have been going on for about a year and a half, they are not progressive   Overall 5 pound weight loss in 6 months, intentionally.   Rare pyrosis with certain foods.    He just started on omeprazole 2 months ago, he is taking it every day.  Has not noticed any difference on the pill.     Review of systems: Pertinent positive and negative review of systems were noted in the above HPI section. Complete review of systems was performed and was otherwise normal.    Past Medical History  Diagnosis Date  . Hyperlipidemia   . Hypothyroidism   . OA (osteoarthritis)   . ACL tear     Bilateral- Dr. Wyline Mood  . Depression   . Kidney stone     hx  . Alcohol abuse   . Asthma     Past Surgical History  Procedure Date  . Nasal sinus surgery 1988/2008  . Finger surgery     Current Outpatient Prescriptions  Medication Sig Dispense Refill  . buPROPion (WELLBUTRIN XL) 300 MG 24 hr tablet Take 300 mg by mouth daily.        . cholecalciferol (VITAMIN D) 1000 UNITS tablet Take 1,000 Units by mouth daily.        Marland Kitchen levothyroxine (SYNTHROID, LEVOTHROID) 137 MCG tablet Take 137 mcg by mouth daily.        . meloxicam (MOBIC) 15 MG tablet Take 15 mg by mouth daily.        Marland Kitchen omeprazole (PRILOSEC) 40 MG capsule Take 1 capsule (40 mg total) by mouth daily.  30 capsule  11  . simvastatin (ZOCOR) 40 MG tablet TAKE ONE TABLET BY MOUTH ONCE DAILY  90 tablet  1    Allergies as of 03/07/2011  . (No Known Allergies)    Family History  Problem Relation Age of Onset  . Heart disease Mother     valve surgery    History   Social History  . Marital Status: Married    Spouse Name: N/A    Number of Children: N/A    . 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 Smoking status: Current Everyday Smoker -- 0.5 packs/day  . Smokeless tobacco: Never Used  . Alcohol Use: No  . Drug Use: No  . Sexually Active: Not on file   Other Topics Concern  . Not on file   Social History Narrative  . No narrative on file       Physical Exam: BP 120/84  Pulse 68  Ht 6\' 1"  (1.854 m)  Wt 186 lb 9.6 oz (84.641 kg)  BMI 24.62 kg/m2 Constitutional: generally well-appearing Psychiatric: alert and oriented x3 Eyes: extraocular movements intact Mouth: oral pharynx moist, no lesions Neck: supple no lymphadenopathy Cardiovascular: heart regular rate and rhythm Lungs: clear to auscultation bilaterally Abdomen: soft, nontender, nondistended, no obvious ascites, no peritoneal signs, normal bowel sounds Extremities: no lower extremity edema bilaterally Skin: no lesions on visible extremities    Assessment and plan: 50 y.o. male with  intermittent, nonprogressive, dysphasia.  His dysphagia only occurs with  tater tots from [employer] and rice from a particular restaurant. He does not swallowing troubles with any other solid food or liquids either. Proton pump inhibitors have not helped his symptoms and so I recommended he stop them. We will proceed with EGD at his soonest convenience to check for neoplasm, peptic stricturing, rings.

## 2011-03-18 ENCOUNTER — Other Ambulatory Visit: Payer: Self-pay | Admitting: Internal Medicine

## 2011-03-20 ENCOUNTER — Encounter: Payer: Self-pay | Admitting: Gastroenterology

## 2011-03-20 ENCOUNTER — Ambulatory Visit (AMBULATORY_SURGERY_CENTER): Payer: BC Managed Care – PPO | Admitting: Gastroenterology

## 2011-03-20 VITALS — BP 133/84 | HR 115 | Temp 97.6°F | Resp 17 | Ht 73.0 in | Wt 186.0 lb

## 2011-03-20 DIAGNOSIS — R1310 Dysphagia, unspecified: Secondary | ICD-10-CM

## 2011-03-20 DIAGNOSIS — K2940 Chronic atrophic gastritis without bleeding: Secondary | ICD-10-CM

## 2011-03-20 DIAGNOSIS — K2970 Gastritis, unspecified, without bleeding: Secondary | ICD-10-CM

## 2011-03-20 MED ORDER — SODIUM CHLORIDE 0.9 % IV SOLN: 500.0000 mL | INTRAVENOUS | Status: DC

## 2011-03-20 NOTE — Progress Notes (Signed)
Patient did not experience any of the following events: a burn prior to discharge; a fall within the facility; wrong site/side/patient/procedure/implant event; or a hospital transfer or hospital admission upon discharge from the facility. (G8907) Patient did not have preoperative order for IV antibiotic SSI prophylaxis. (G8918)  

## 2011-03-20 NOTE — Patient Instructions (Addendum)
One of your biggest health concerns is your smoking.  This increases your risk for most cancers and serious cardiovascular diseases such as strokes, heart attacks.  You should try your best to stop.  If you need assistance, please contact your PCP or Smoking Cessation Class at [facility] [phone]) or [facility] Quit-Line (1-800-QUIT-NOW). You should restart once daily antiacid medicine (such as over the counter prilosec, prevacid or their generic equivalents). These are best taken 20-30 minutes prior to a meal.  Please follow discharge instructions given today. See handouts. Resume medications today. Biopsies taken today. You will receive result letter in your mail in 1-2 weeks. Call us with any questions or concerns. Thank you!!

## 2011-03-21 ENCOUNTER — Telehealth: Payer: Self-pay

## 2011-03-21 NOTE — Telephone Encounter (Signed)

## 2011-04-03 NOTE — Progress Notes (Signed)
Summary: COUGH,CONGESTION/TJ rm 4   Vital Signs:  Patient Profile:   50 Years Old Male CC:      Cold & URI symptoms x 6 days Height:     74 inches (187.96 cm) Weight:      188.75 pounds O2 Sat:      97 % O2 treatment:    Room Air Temp:     98.9 degrees F oral Pulse rate:   96 / minute Resp:     20 per minute BP sitting:   125 / 77  (left arm) Cuff size:   regular  Vitals Entered By: Clemens Catholic LPN (Sep 15, 2010 5:41 PM)                  Updated Prior Medication List: WELLBUTRIN XL 300 MG XR24H-TAB (BUPROPION HCL) once daily VITAMIN D3 1000 UNIT  TABS (CHOLECALCIFEROL) 1 by mouth daily SIMVASTATIN 40 MG TABS (SIMVASTATIN) Take one tablet qd MOBIC 15 MG TABS (MELOXICAM) once daily SYNTHROID 137 MCG TABS (LEVOTHYROXINE SODIUM) 1 by mouth qd  Current Allergies (reviewed today): No known allergies History of Present Illness History from: patient Chief Complaint: Cold & URI symptoms x 6 days History of Present Illness: 50 Years Old Male complains of onset of cold symptoms for a week.  [name] [name] has been using OTC cough/cold meds which is helping a little bit.  However he thinks he is getting a little worse in terms of sinus pressure, green nasal discharge, chills last night.  He is a smoker. No sore throat + cough No pleuritic pain No wheezing + nasal congestion + post-nasal drainage + sinus pain/pressure +chest congestion No itchy/red eyes No earache No hemoptysis No SOB + chills/sweats No fever No nausea No vomiting No abdominal pain No diarrhea No skin rashes No fatigue No myalgias No headache   REVIEW OF SYSTEMS Constitutional Symptoms       Complains of fever, chills, and night sweats.     Denies weight loss, weight gain, and fatigue.  Eyes       Complains of eye pain.      Denies change in vision, eye discharge, glasses, contact lenses, and eye surgery. Ear/Nose/Throat/Mouth       Complains of sinus problems and hoarseness.      Denies hearing  loss/aids, change in hearing, ear pain, ear discharge, dizziness, frequent runny nose, frequent nose bleeds, sore throat, and tooth pain or bleeding.  Respiratory       Complains of dry cough, wheezing, and shortness of breath.      Denies productive cough, asthma, bronchitis, and emphysema/COPD.  Cardiovascular       Denies murmurs, chest pain, and tires easily with exhertion.    Gastrointestinal       Denies stomach pain, nausea/vomiting, diarrhea, constipation, blood in bowel movements, and indigestion. Genitourniary       Denies painful urination, kidney stones, and loss of urinary control. Neurological       Denies paralysis, seizures, and fainting/blackouts. Musculoskeletal       Denies muscle pain, joint pain, joint stiffness, decreased range of motion, redness, swelling, muscle weakness, and gout.  Skin       Denies bruising, unusual mles/lumps or sores, and hair/skin or nail changes.  Psych       Denies mood changes, temper/anger issues, anxiety/stress, speech problems, depression, and sleep problems. Other Comments: pt c/o green/yellow nasal d/d, chest feels tight, dry cough, a little SOB, and chills last night(?fever) x 6 days. he  has taken OTC cold med with no relief.   Past History:  Past Medical History: Reviewed history from 03/15/2009 and no changes required. Hyperlipidemia Hypothyroidism Osteoarthritis B ACL partial tear Dr Wyline Mood Depression Heart cath nl 2009 H/o kidney stones  Past Surgical History: sinus surgery 1988/2008 finger surgery 04/92/09  Family History: mother- heart dz  Social History: Reviewed history from 11/23/2008 and no changes required. Occupation: Engineer, materials Married, wife ill Current Smoker 1/2 PPD Drug use-no Physical Exam General appearance: well developed, well nourished, no acute distress Head: maxillary sinus tenderness Ears: normal, no lesions or deformities Nasal: mucosa pink, nonedematous, no septal  deviation, turbinates normal Oral/Pharynx: tongue normal, posterior pharynx without erythema or exudate Chest/Lungs: no rales, wheezes, or rhonchi bilateral, breath sounds equal without effort Heart: regular rate and  rhythm, no murmur MSE: oriented to time, place, and person  Patient Education: Patient and/or caregiver instructed in the following: rest, fluids, Tylenol prn.  Plan New Medications/Changes: AUGMENTIN 875-125 MG TABS (AMOXICILLIN-POT CLAVULANATE) 1 PO two times a day for 10 days  #20 x 0, 09/15/2010, Hoyt Koch MD  New Orders: Est. Patient Level III [phone] Pulse Oximetry (single measurment) [94760] Services provided After hours-Weekends-Holidays [99051] Planning Comments:   1)  Take the prescribed antibiotic as instructed. 2)  Use nasal saline solution (over the counter) at least 3 times a day. 3)  Use over the counter decongestants like Zyrtec-D every 12 hours as needed to help with congestion. 4)  Can take tylenol every 6 hours or motrin every 8 hours for pain or fever. 5)  Follow up with your primary doctor  if no improvement in 5-7 days, sooner if increasing pain, fever, or new symptoms.   If he is getting worse in terms of cough, chest congestion, fever, I would suggest getting a CXR.  As of today, his lungs sound clear so I don't think getting one today is necessary.   The patient and/or caregiver has been counseled thoroughly with regard to medications prescribed including dosage, schedule, interactions, rationale for use, and possible side effects and they verbalize understanding.  Diagnoses and expected course of recovery discussed and will return if not improved as expected or if the condition worsens. Patient and/or caregiver verbalized understanding.  Prescriptions: AUGMENTIN 875-125 MG TABS (AMOXICILLIN-POT CLAVULANATE) 1 PO two times a day for 10 days  #20 x 0   Entered and Authorized by:   Hoyt Koch MD   Signed by:   Hoyt Koch MD on  09/15/2010   Method used:   Print then Give to Patient   RxID:   [id number]   Orders Added: 1)  Est. Patient Level III [95621] 2)  Pulse Oximetry (single measurment) [94760] 3)  Services provided After hours-Weekends-Holidays [99051]

## 2011-06-29 ENCOUNTER — Other Ambulatory Visit: Payer: Self-pay | Admitting: *Deleted

## 2011-06-29 MED ORDER — BUPROPION HCL ER (XL) 300 MG PO TB24: 300.0000 mg | ORAL_TABLET | Freq: Every day | ORAL | Status: DC

## 2011-07-04 ENCOUNTER — Ambulatory Visit: Payer: BC Managed Care – PPO | Admitting: Internal Medicine

## 2011-08-14 ENCOUNTER — Other Ambulatory Visit: Payer: Self-pay | Admitting: Internal Medicine

## 2011-11-21 ENCOUNTER — Other Ambulatory Visit: Payer: Self-pay | Admitting: Internal Medicine

## 2011-12-04 ENCOUNTER — Telehealth: Payer: Self-pay | Admitting: *Deleted

## 2011-12-04 DIAGNOSIS — Z125 Encounter for screening for malignant neoplasm of prostate: Secondary | ICD-10-CM

## 2011-12-04 DIAGNOSIS — Z0000 Encounter for general adult medical examination without abnormal findings: Secondary | ICD-10-CM

## 2011-12-04 NOTE — Telephone Encounter (Signed)
Message copied by Merrilyn Puma on Mon Dec 04, 2011  8:30 AM ------      Message from: Etheleen Sia      Created: Thu Nov 23, 2011  4:35 PM       SCHEDULED FOR A PHYSICAL ON OCT 2.  PLEASE PUT IN THE PHYSICAL LABS.      ----- Message -----         From: Merrilyn Puma, CMA         Sent: 11/22/2011   8:23 AM           To: Remigio Eisenmenger, #            Please contact pt to sched OV. Last seen 12/2010 and AVP wanted him back for 3 mo f/u. I Rf his meds X 90 days.             Thanks!

## 2011-12-04 NOTE — Telephone Encounter (Signed)
CPE labs entered.  

## 2012-01-02 ENCOUNTER — Other Ambulatory Visit: Payer: Self-pay | Admitting: Internal Medicine

## 2012-01-15 ENCOUNTER — Other Ambulatory Visit: Payer: Self-pay | Admitting: Internal Medicine

## 2012-01-25 ENCOUNTER — Other Ambulatory Visit (INDEPENDENT_AMBULATORY_CARE_PROVIDER_SITE_OTHER): Payer: BC Managed Care – PPO

## 2012-01-25 DIAGNOSIS — Z0000 Encounter for general adult medical examination without abnormal findings: Secondary | ICD-10-CM

## 2012-01-25 DIAGNOSIS — Z125 Encounter for screening for malignant neoplasm of prostate: Secondary | ICD-10-CM

## 2012-01-25 DIAGNOSIS — E785 Hyperlipidemia, unspecified: Secondary | ICD-10-CM

## 2012-01-25 LAB — CBC WITH DIFFERENTIAL/PLATELET
Basophils Absolute: 0 10*3/uL (ref 0.0–0.1)
Eosinophils Absolute: 0.1 10*3/uL (ref 0.0–0.7)
HCT: 41.2 % (ref 39.0–52.0)
Lymphs Abs: 1.2 10*3/uL (ref 0.7–4.0)
MCV: 92.9 fl (ref 78.0–100.0)
Monocytes Absolute: 0.7 10*3/uL (ref 0.1–1.0)
Platelets: 233 10*3/uL (ref 150.0–400.0)
RDW: 13.4 % (ref 11.5–14.6)

## 2012-01-25 LAB — URINALYSIS, ROUTINE W REFLEX MICROSCOPIC
Bilirubin Urine: NEGATIVE
Hgb urine dipstick: NEGATIVE
Nitrite: NEGATIVE
Urine Glucose: NEGATIVE
Urobilinogen, UA: 0.2 (ref 0.0–1.0)

## 2012-01-25 LAB — LDL CHOLESTEROL, DIRECT: Direct LDL: 135.6 mg/dL

## 2012-01-25 LAB — HEPATIC FUNCTION PANEL
ALT: 29 U/L (ref 0–53)
Total Protein: 6.3 g/dL (ref 6.0–8.3)

## 2012-01-25 LAB — BASIC METABOLIC PANEL
BUN: 14 mg/dL (ref 6–23)
CO2: 28 mEq/L (ref 19–32)
Chloride: 103 mEq/L (ref 96–112)
Creatinine, Ser: 0.9 mg/dL (ref 0.4–1.5)

## 2012-01-25 LAB — TSH: TSH: 1.29 u[IU]/mL (ref 0.35–5.50)

## 2012-01-25 LAB — LIPID PANEL: Cholesterol: 201 mg/dL — ABNORMAL HIGH (ref 0–200)

## 2012-01-31 ENCOUNTER — Ambulatory Visit (INDEPENDENT_AMBULATORY_CARE_PROVIDER_SITE_OTHER): Payer: BC Managed Care – PPO | Admitting: Internal Medicine

## 2012-01-31 ENCOUNTER — Encounter: Payer: Self-pay | Admitting: Internal Medicine

## 2012-01-31 VITALS — BP 140/90 | HR 80 | Temp 98.1°F | Resp 16 | Wt 198.0 lb

## 2012-01-31 DIAGNOSIS — Z23 Encounter for immunization: Secondary | ICD-10-CM

## 2012-01-31 DIAGNOSIS — Z0000 Encounter for general adult medical examination without abnormal findings: Secondary | ICD-10-CM

## 2012-01-31 DIAGNOSIS — M75101 Unspecified rotator cuff tear or rupture of right shoulder, not specified as traumatic: Secondary | ICD-10-CM

## 2012-01-31 DIAGNOSIS — E039 Hypothyroidism, unspecified: Secondary | ICD-10-CM

## 2012-01-31 DIAGNOSIS — Z720 Tobacco use: Secondary | ICD-10-CM

## 2012-01-31 DIAGNOSIS — S43429A Sprain of unspecified rotator cuff capsule, initial encounter: Secondary | ICD-10-CM

## 2012-01-31 DIAGNOSIS — F17200 Nicotine dependence, unspecified, uncomplicated: Secondary | ICD-10-CM

## 2012-01-31 NOTE — Assessment & Plan Note (Signed)
1/2 ppd 

## 2012-01-31 NOTE — Assessment & Plan Note (Addendum)
We discussed age appropriate health related issues, including available/recomended screening tests and vaccinations. We discussed a need for adhering to healthy diet and exercise. Labs/EKG were reviewed/ordered. All questions were answered.   Working 80 h/wk. Smoking 1/2 ppd. Wife w/dementia -stress

## 2012-01-31 NOTE — Assessment & Plan Note (Signed)
1/2 ppd Discussed  

## 2012-01-31 NOTE — Assessment & Plan Note (Signed)
2013 R - he had MRI  Dr Thurston Hole

## 2012-01-31 NOTE — Assessment & Plan Note (Signed)
Continue with current prescription therapy as reflected on the Med list.  

## 2012-01-31 NOTE — Progress Notes (Signed)
Subjective:    Patient ID: [name] [name], male    DOB: December 24, 1960, 51 y.o.   MRN: [id number]  HPI The patient is here for a wellness exam. The patient has been doing well overall without major physical or psychological issues going on lately. Working 80 h/wk. Smoking 1/2 ppd. Wife w/dementia -stress   The patient presents for a follow-up of  chronic hypertension, chronic dyslipidemia, hypothyroidism controlled with medicines. C/o some foods get stuck in throat - rice and fried potatoes - occasionally.  C/o cough.  BP Readings from Last 3 Encounters:  01/31/12 140/90  03/20/11 133/84  03/07/11 120/84    Wt Readings from Last 3 Encounters:  01/31/12 198 lb (89.812 kg)  03/20/11 186 lb (84.369 kg)  03/07/11 186 lb 9.6 oz (84.641 kg)      Review of Systems  Constitutional: Positive for unexpected weight change (10 lbs on diet). Negative for appetite change and fatigue.  HENT: Negative for nosebleeds, congestion, sore throat, sneezing, trouble swallowing and neck pain.   Eyes: Negative for itching and visual disturbance.  Respiratory: Positive for cough.   Cardiovascular: Negative for chest pain, palpitations and leg swelling.  Gastrointestinal: Negative for nausea, diarrhea, blood in stool and abdominal distention.  Genitourinary: Negative for frequency and hematuria.  Musculoskeletal: Negative for back pain, joint swelling and gait problem.  Skin: Negative for rash.  Neurological: Negative for dizziness, tremors, speech difficulty and weakness.  Psychiatric/Behavioral: Negative for disturbed wake/sleep cycle, dysphoric mood and agitation. The patient is not nervous/anxious.        Objective:   Physical Exam  Constitutional: He is oriented to person, place, and time. He appears well-developed.  HENT:  Mouth/Throat: Oropharynx is clear and moist.  Eyes: Conjunctivae normal are normal. Pupils are equal, round, and reactive to light.  Neck: Normal range of motion. No JVD  present. No thyromegaly present.  Cardiovascular: Normal rate, regular rhythm, normal heart sounds and intact distal pulses.  Exam reveals no gallop and no friction rub.   No murmur heard. Pulmonary/Chest: Effort normal and breath sounds normal. No respiratory distress. He has no wheezes. He has no rales. He exhibits no tenderness.  Abdominal: Soft. Bowel sounds are normal. He exhibits no distension and no mass. There is no tenderness. There is no rebound and no guarding.  Musculoskeletal: Normal range of motion. He exhibits no edema and no tenderness.  Lymphadenopathy:    He has no cervical adenopathy.  Neurological: He is alert and oriented to person, place, and time. He has normal reflexes. No cranial nerve deficit. He exhibits normal muscle tone. Coordination normal.  Skin: Skin is warm and dry. No rash noted.  Psychiatric: He has a normal mood and affect. His behavior is normal. Judgment and thought content normal.     Lab Results  Component Value Date   WBC 7.5 01/25/2012   HGB 13.6 01/25/2012   HCT 41.2 01/25/2012   PLT 233.0 01/25/2012   GLUCOSE 103* 01/25/2012   CHOL 201* 01/25/2012   TRIG 123.0 01/25/2012   HDL 42.40 01/25/2012   LDLDIRECT 135.6 01/25/2012   LDLCALC 126* 03/02/2010   ALT 29 01/25/2012   AST 20 01/25/2012   NA 138 01/25/2012   K 4.7 01/25/2012   CL 103 01/25/2012   CREATININE 0.9 01/25/2012   BUN 14 01/25/2012   CO2 28 01/25/2012   TSH 1.29 01/25/2012   PSA 0.39 01/25/2012   INR 0.9 RATIO 01/28/2007        Assessment &  Plan:

## 2012-02-12 ENCOUNTER — Other Ambulatory Visit: Payer: Self-pay | Admitting: Internal Medicine

## 2012-02-26 ENCOUNTER — Other Ambulatory Visit: Payer: Self-pay | Admitting: Internal Medicine

## 2012-04-11 ENCOUNTER — Other Ambulatory Visit: Payer: Self-pay | Admitting: Internal Medicine

## 2012-06-24 ENCOUNTER — Other Ambulatory Visit: Payer: Self-pay | Admitting: Internal Medicine

## 2012-08-16 ENCOUNTER — Other Ambulatory Visit: Payer: Self-pay | Admitting: Internal Medicine

## 2012-08-28 ENCOUNTER — Other Ambulatory Visit: Payer: Self-pay | Admitting: Internal Medicine

## 2012-12-26 ENCOUNTER — Other Ambulatory Visit: Payer: Self-pay | Admitting: Internal Medicine

## 2013-01-13 ENCOUNTER — Other Ambulatory Visit: Payer: Self-pay | Admitting: Internal Medicine

## 2013-02-24 ENCOUNTER — Other Ambulatory Visit: Payer: Self-pay | Admitting: Internal Medicine

## 2013-03-10 ENCOUNTER — Encounter: Payer: Self-pay | Admitting: Internal Medicine

## 2013-03-10 ENCOUNTER — Ambulatory Visit (INDEPENDENT_AMBULATORY_CARE_PROVIDER_SITE_OTHER): Payer: BC Managed Care – PPO | Admitting: Internal Medicine

## 2013-03-10 VITALS — BP 148/92 | HR 84 | Temp 98.1°F | Resp 16 | Ht 73.0 in | Wt 196.0 lb

## 2013-03-10 DIAGNOSIS — Z0000 Encounter for general adult medical examination without abnormal findings: Secondary | ICD-10-CM

## 2013-03-10 NOTE — Progress Notes (Signed)
   Subjective:   HPI The patient is here for a wellness exam. The patient has been doing well overall without major physical or psychological issues going on lately. Working 50 h/wk. Smoking 1/2 ppd. Wife w/dementia -stressed   The patient presents for a follow-up of  chronic hypertension, chronic dyslipidemia, hypothyroidism controlled with medicines. BP is nl at home  BP Readings from Last 3 Encounters:  03/10/13 148/92  01/31/12 140/90  03/20/11 133/84    Wt Readings from Last 3 Encounters:  03/10/13 196 lb (88.905 kg)  01/31/12 198 lb (89.812 kg)  03/20/11 186 lb (84.369 kg)      Review of Systems  Constitutional: Positive for unexpected weight change (10 lbs on diet). Negative for appetite change and fatigue.  HENT: Negative for congestion, nosebleeds, sneezing, sore throat and trouble swallowing.   Eyes: Negative for itching and visual disturbance.  Respiratory: Positive for cough.   Cardiovascular: Negative for chest pain, palpitations and leg swelling.  Gastrointestinal: Negative for nausea, diarrhea, blood in stool and abdominal distention.  Genitourinary: Negative for frequency and hematuria.  Musculoskeletal: Negative for back pain, gait problem, joint swelling and neck pain.  Skin: Negative for rash.  Neurological: Negative for dizziness, tremors, speech difficulty and weakness.  Psychiatric/Behavioral: Negative for sleep disturbance, dysphoric mood and agitation. The patient is not nervous/anxious.        Objective:   Physical Exam  Constitutional: He is oriented to person, place, and time. He appears well-developed.  HENT:  Mouth/Throat: Oropharynx is clear and moist.  Eyes: Conjunctivae are normal. Pupils are equal, round, and reactive to light.  Neck: Normal range of motion. No JVD present. No thyromegaly present.  Cardiovascular: Normal rate, regular rhythm, normal heart sounds and intact distal pulses.  Exam reveals no gallop and no friction rub.   No  murmur heard. Pulmonary/Chest: Effort normal and breath sounds normal. No respiratory distress. He has no wheezes. He has no rales. He exhibits no tenderness.  Abdominal: Soft. Bowel sounds are normal. He exhibits no distension and no mass. There is no tenderness. There is no rebound and no guarding.  Genitourinary: Rectum normal and prostate normal. Guaiac negative stool.  Musculoskeletal: Normal range of motion. He exhibits no edema and no tenderness.  Lymphadenopathy:    He has no cervical adenopathy.  Neurological: He is alert and oriented to person, place, and time. He has normal reflexes. No cranial nerve deficit. He exhibits normal muscle tone. Coordination normal.  Skin: Skin is warm and dry. No rash noted.  Psychiatric: He has a normal mood and affect. His behavior is normal. Judgment and thought content normal.     Lab Results  Component Value Date   WBC 7.5 01/25/2012   HGB 13.6 01/25/2012   HCT 41.2 01/25/2012   PLT 233.0 01/25/2012   GLUCOSE 103* 01/25/2012   CHOL 201* 01/25/2012   TRIG 123.0 01/25/2012   HDL 42.40 01/25/2012   LDLDIRECT 135.6 01/25/2012   LDLCALC 126* 03/02/2010   ALT 29 01/25/2012   AST 20 01/25/2012   NA 138 01/25/2012   K 4.7 01/25/2012   CL 103 01/25/2012   CREATININE 0.9 01/25/2012   BUN 14 01/25/2012   CO2 28 01/25/2012   TSH 1.29 01/25/2012   PSA 0.39 01/25/2012   INR 0.9 RATIO 01/28/2007        Assessment & Plan:

## 2013-03-10 NOTE — Assessment & Plan Note (Addendum)
We discussed age appropriate health related issues, including available/recomended screening tests and vaccinations. We discussed a need for adhering to healthy diet and exercise. Labs/EKG were reviewed/ordered. All questions were answered. Declined colonoscopy

## 2013-03-12 ENCOUNTER — Other Ambulatory Visit (INDEPENDENT_AMBULATORY_CARE_PROVIDER_SITE_OTHER): Payer: BC Managed Care – PPO

## 2013-03-12 DIAGNOSIS — Z0000 Encounter for general adult medical examination without abnormal findings: Secondary | ICD-10-CM

## 2013-03-12 LAB — BASIC METABOLIC PANEL
CO2: 28 mEq/L (ref 19–32)
Calcium: 9.4 mg/dL (ref 8.4–10.5)
GFR: 95.86 mL/min (ref >60.00)
Glucose, Bld: 99 mg/dL (ref 70–99)
Potassium: 4.8 mEq/L (ref 3.5–5.1)
Sodium: 137 mEq/L (ref 135–145)

## 2013-03-12 LAB — HEPATIC FUNCTION PANEL
ALT: 29 U/L (ref 0–53)
AST: 21 U/L (ref 0–37)
Albumin: 3.8 g/dL (ref 3.5–5.2)
Total Bilirubin: 0.7 mg/dL (ref 0.3–1.2)

## 2013-03-12 LAB — CBC WITH DIFFERENTIAL/PLATELET
Basophils Absolute: 0 10*3/uL (ref 0.0–0.1)
Hemoglobin: 13.8 g/dL (ref 13.0–17.0)
Lymphocytes Relative: 19.4 % (ref 12.0–46.0)
MCHC: 34.1 g/dL (ref 30.0–36.0)
Monocytes Absolute: 0.7 10*3/uL (ref 0.1–1.0)
Monocytes Relative: 10.6 % (ref 3.0–12.0)
Neutro Abs: 4.5 10*3/uL (ref 1.4–7.7)
Neutrophils Relative %: 67.1 % (ref 43.0–77.0)
RDW: 13.5 % (ref 11.5–14.6)

## 2013-03-12 LAB — LIPID PANEL
Cholesterol: 170 mg/dL (ref 0–200)
LDL Cholesterol: 114 mg/dL — ABNORMAL HIGH (ref 0–99)
Triglycerides: 56 mg/dL (ref 0.0–149.0)
VLDL: 11.2 mg/dL (ref 0.0–40.0)

## 2013-03-12 LAB — PSA: PSA: 0.32 ng/mL (ref 0.10–4.00)

## 2013-03-12 LAB — URINALYSIS
Bilirubin Urine: NEGATIVE
Ketones, ur: NEGATIVE
Leukocytes, UA: NEGATIVE
Nitrite: NEGATIVE
Specific Gravity, Urine: <=1.005 (ref 1.000–1.030)
Total Protein, Urine: NEGATIVE
pH: 7 (ref 5.0–8.0)

## 2013-03-12 LAB — TSH: TSH: 2.21 u[IU]/mL (ref 0.35–5.50)

## 2013-04-04 ENCOUNTER — Other Ambulatory Visit: Payer: Self-pay | Admitting: Internal Medicine

## 2013-05-18 ENCOUNTER — Other Ambulatory Visit: Payer: Self-pay | Admitting: Internal Medicine

## 2013-05-25 ENCOUNTER — Other Ambulatory Visit: Payer: Self-pay | Admitting: Internal Medicine

## 2013-10-27 ENCOUNTER — Ambulatory Visit (INDEPENDENT_AMBULATORY_CARE_PROVIDER_SITE_OTHER): Payer: BC Managed Care – PPO | Admitting: Internal Medicine

## 2013-10-27 ENCOUNTER — Ambulatory Visit (INDEPENDENT_AMBULATORY_CARE_PROVIDER_SITE_OTHER)
Admission: RE | Admit: 2013-10-27 | Discharge: 2013-10-27 | Disposition: A | Discharge status: Home / Self Care | Payer: BC Managed Care – PPO | Source: Ambulatory Visit | Attending: Internal Medicine | Admitting: Internal Medicine

## 2013-10-27 ENCOUNTER — Encounter: Payer: Self-pay | Admitting: Internal Medicine

## 2013-10-27 VITALS — BP 120/88 | HR 76 | Temp 98.4°F | Resp 16 | Wt 199.0 lb

## 2013-10-27 DIAGNOSIS — J439 Emphysema, unspecified: Secondary | ICD-10-CM

## 2013-10-27 DIAGNOSIS — R05 Cough: Secondary | ICD-10-CM

## 2013-10-27 DIAGNOSIS — R059 Cough, unspecified: Secondary | ICD-10-CM

## 2013-10-27 DIAGNOSIS — Z720 Tobacco use: Secondary | ICD-10-CM

## 2013-10-27 DIAGNOSIS — F17200 Nicotine dependence, unspecified, uncomplicated: Secondary | ICD-10-CM

## 2013-10-27 MED ORDER — METHYLPREDNISOLONE ACETATE 80 MG/ML IJ SUSP
80.0000 mg | Freq: Once | INTRAMUSCULAR | Status: AC
Start: 2013-10-27 — End: 2013-10-27
  Administered 2013-10-27: 80 mg via INTRAMUSCULAR

## 2013-10-27 MED ORDER — UMECLIDINIUM-VILANTEROL 62.5-25 MCG/INH IN AEPB: 1.0000 | INHALATION_SPRAY | Freq: Every day | RESPIRATORY_TRACT | Status: DC

## 2013-10-27 MED ORDER — ALBUTEROL SULFATE 108 (90 BASE) MCG/ACT IN AEPB: 1.0000 | INHALATION_SPRAY | Freq: Four times a day (QID) | RESPIRATORY_TRACT | Status: DC | PRN

## 2013-10-27 NOTE — Progress Notes (Signed)
Subjective:   HPI   C/o asthma flare up x 2 wks The patient has been doing well overall without major physical or psychological issues going on lately. Working 55 h/wk. Smoking 1/2 ppd. Wife w/dementia -stressed   The patient presents for a follow-up of  chronic hypertension, chronic dyslipidemia, hypothyroidism controlled with medicines. BP is nl at home  BP Readings from Last 3 Encounters:  10/27/13 120/88  03/10/13 148/92  01/31/12 140/90    Wt Readings from Last 3 Encounters:  10/27/13 199 lb (90.266 kg)  03/10/13 196 lb (88.905 kg)  01/31/12 198 lb (89.812 kg)      Review of Systems  Constitutional: Positive for unexpected weight change (10 lbs on diet). Negative for appetite change and fatigue.  HENT: Negative for congestion, nosebleeds, sneezing, sore throat and trouble swallowing.   Eyes: Negative for itching and visual disturbance.  Respiratory: Positive for cough.   Cardiovascular: Negative for chest pain, palpitations and leg swelling.  Gastrointestinal: Negative for nausea, diarrhea, blood in stool and abdominal distention.  Genitourinary: Negative for frequency and hematuria.  Musculoskeletal: Negative for back pain, gait problem, joint swelling and neck pain.  Skin: Negative for rash.  Neurological: Negative for dizziness, tremors, speech difficulty and weakness.  Psychiatric/Behavioral: Negative for sleep disturbance, dysphoric mood and agitation. The patient is not nervous/anxious.        Objective:   Physical Exam  Constitutional: He is oriented to person, place, and time. He appears well-developed.  HENT:  Mouth/Throat: Oropharynx is clear and moist.  Eyes: Conjunctivae are normal. Pupils are equal, round, and reactive to light.  Neck: Normal range of motion. No JVD present. No thyromegaly present.  Cardiovascular: Normal rate, regular rhythm, normal heart sounds and intact distal pulses.  Exam reveals no gallop and no friction rub.   No murmur  heard. Pulmonary/Chest: Effort normal and breath sounds normal. No respiratory distress. He has no wheezes. He has no rales. He exhibits no tenderness.  Abdominal: Soft. Bowel sounds are normal. He exhibits no distension and no mass. There is no tenderness. There is no rebound and no guarding.  Genitourinary: Rectum normal and prostate normal. Guaiac negative stool.  Musculoskeletal: Normal range of motion. He exhibits no edema and no tenderness.  Lymphadenopathy:    He has no cervical adenopathy.  Neurological: He is alert and oriented to person, place, and time. He has normal reflexes. No cranial nerve deficit. He exhibits normal muscle tone. Coordination normal.  Skin: Skin is warm and dry. No rash noted.  Psychiatric: He has a normal mood and affect. His behavior is normal. Judgment and thought content normal.     Lab Results  Component Value Date   WBC 6.7 03/12/2013   HGB 13.8 03/12/2013   HCT 40.5 03/12/2013   PLT 235.0 03/12/2013   GLUCOSE 99 03/12/2013   CHOL 170 03/12/2013   TRIG 56.0 03/12/2013   HDL 44.90 03/12/2013   LDLDIRECT 135.6 01/25/2012   LDLCALC 114* 03/12/2013   ALT 29 03/12/2013   AST 21 03/12/2013   NA 137 03/12/2013   K 4.8 03/12/2013   CL 102 03/12/2013   CREATININE 0.9 03/12/2013   BUN 12 03/12/2013   CO2 28 03/12/2013   TSH 2.21 03/12/2013   PSA 0.32 03/12/2013   INR 0.9 RATIO 01/28/2007   I personally provided Anoro inhaler use teaching. After the teaching patient was able to demonstrate it's use effectively. All questions were answered      Assessment & Plan:

## 2013-10-27 NOTE — Assessment & Plan Note (Signed)
<  1/2 ppd - discussed ?

## 2013-10-27 NOTE — Assessment & Plan Note (Addendum)
2011 by CT -- Chronic; ongoing smoking - discussed Exacerbation Anoro qd Albuterol prn Depomedrol 80 mg IM CXR

## 2013-10-27 NOTE — Progress Notes (Signed)
Pre visit review using our clinic review tool, if applicable. No additional management support is needed unless otherwise documented below in the visit note. 

## 2013-11-21 ENCOUNTER — Other Ambulatory Visit: Payer: Self-pay | Admitting: Internal Medicine

## 2014-01-01 ENCOUNTER — Other Ambulatory Visit: Payer: Self-pay | Admitting: Internal Medicine

## 2014-02-03 ENCOUNTER — Encounter: Payer: Self-pay | Admitting: Internal Medicine

## 2014-02-03 ENCOUNTER — Ambulatory Visit (INDEPENDENT_AMBULATORY_CARE_PROVIDER_SITE_OTHER): Payer: BC Managed Care – PPO | Admitting: Internal Medicine

## 2014-02-03 VITALS — BP 130/96 | HR 107 | Temp 98.1°F | Resp 14 | Wt 196.2 lb

## 2014-02-03 DIAGNOSIS — L03113 Cellulitis of right upper limb: Secondary | ICD-10-CM

## 2014-02-03 DIAGNOSIS — M71021 Abscess of bursa, right elbow: Secondary | ICD-10-CM

## 2014-02-03 MED ORDER — AMOXICILLIN-POT CLAVULANATE 875-125 MG PO TABS: 1.0000 | ORAL_TABLET | Freq: Two times a day (BID) | ORAL | Status: DC

## 2014-02-03 NOTE — Progress Notes (Signed)
Pre visit review using our clinic review tool, if applicable. No additional management support is needed unless otherwise documented below in the visit note. 

## 2014-02-03 NOTE — Patient Instructions (Addendum)
Use an  ink  pen to outline the area of redness after showering each day. This should shrink in size each day. Report warning signs as discussed: Increasing pain, swelling, or red streaks spreading toward the body. If present go to ER

## 2014-02-03 NOTE — Progress Notes (Signed)
   Subjective:    Patient ID: [name] [name], male    DOB: 1960/05/18, 53 y.o.   MRN: [id number]  HPI   He noted slight swelling & redness of the right elbow on 01/28/14. 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  He's had minimal pain which last seconds  Today he noted erythema of the right ulnar forearm prompting the visit.      Review of Systems   He denies fever, chills, or sweats  He's had no purulent drainage from the lesion  He denies joint stiffness at the elbow.  He is having no muscle weakness or extremity numbness/tingling.       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 There is an area of cellulitis over the right ulnar forearm measuring 23 x 10 cm. This is nontender. He has swelling and erythema of the right elbow with some slight fluctuance and exfoliation centrally of the dermis. Breath sounds are somewhat decreased. He has an S4 gallop. He has no lymphadenopathy of the epitrochlear, axillary, cervical lymph nodes.        Assessment & Plan:  #1 abscess right elbow  #2 cellulitis right forearm  Plan: See orders and recommendations

## 2014-02-04 ENCOUNTER — Telehealth: Payer: Self-pay | Admitting: Internal Medicine

## 2014-02-04 NOTE — Telephone Encounter (Signed)
emmi emailed °

## 2014-02-23 ENCOUNTER — Other Ambulatory Visit: Payer: Self-pay | Admitting: Internal Medicine

## 2014-02-27 ENCOUNTER — Ambulatory Visit (INDEPENDENT_AMBULATORY_CARE_PROVIDER_SITE_OTHER): Payer: BC Managed Care – PPO | Admitting: Internal Medicine

## 2014-02-27 ENCOUNTER — Encounter: Payer: Self-pay | Admitting: Internal Medicine

## 2014-02-27 VITALS — BP 128/84 | HR 88 | Temp 98.0°F | Ht 73.0 in | Wt 201.0 lb

## 2014-02-27 DIAGNOSIS — E038 Other specified hypothyroidism: Secondary | ICD-10-CM

## 2014-02-27 DIAGNOSIS — Z23 Encounter for immunization: Secondary | ICD-10-CM

## 2014-02-27 DIAGNOSIS — F4323 Adjustment disorder with mixed anxiety and depressed mood: Secondary | ICD-10-CM

## 2014-02-27 DIAGNOSIS — E034 Atrophy of thyroid (acquired): Secondary | ICD-10-CM

## 2014-02-27 DIAGNOSIS — E785 Hyperlipidemia, unspecified: Secondary | ICD-10-CM

## 2014-02-27 MED ORDER — VENLAFAXINE HCL ER 75 MG PO CP24: 75.0000 mg | ORAL_CAPSULE | Freq: Every day | ORAL | Status: DC

## 2014-02-27 NOTE — Assessment & Plan Note (Signed)
Labs

## 2014-02-27 NOTE — Progress Notes (Signed)
Pre visit review using our clinic review tool, if applicable. No additional management support is needed unless otherwise documented below in the visit note. 

## 2014-02-27 NOTE — Assessment & Plan Note (Signed)
Continue with current prescription therapy as reflected on the Med list.  

## 2014-02-27 NOTE — Assessment & Plan Note (Signed)
Discussed Will start Effexor XR

## 2014-02-28 ENCOUNTER — Encounter: Payer: Self-pay | Admitting: Internal Medicine

## 2014-04-27 ENCOUNTER — Other Ambulatory Visit: Payer: Self-pay | Admitting: Internal Medicine

## 2014-04-29 ENCOUNTER — Ambulatory Visit (INDEPENDENT_AMBULATORY_CARE_PROVIDER_SITE_OTHER): Payer: BC Managed Care – PPO | Admitting: Internal Medicine

## 2014-04-29 ENCOUNTER — Other Ambulatory Visit (INDEPENDENT_AMBULATORY_CARE_PROVIDER_SITE_OTHER): Payer: BC Managed Care – PPO

## 2014-04-29 ENCOUNTER — Encounter: Payer: Self-pay | Admitting: Internal Medicine

## 2014-04-29 VITALS — BP 120/84 | HR 82 | Temp 98.3°F | Wt 195.0 lb

## 2014-04-29 DIAGNOSIS — E034 Atrophy of thyroid (acquired): Secondary | ICD-10-CM

## 2014-04-29 DIAGNOSIS — F4323 Adjustment disorder with mixed anxiety and depressed mood: Secondary | ICD-10-CM

## 2014-04-29 DIAGNOSIS — Z0000 Encounter for general adult medical examination without abnormal findings: Secondary | ICD-10-CM

## 2014-04-29 DIAGNOSIS — J439 Emphysema, unspecified: Secondary | ICD-10-CM

## 2014-04-29 DIAGNOSIS — E038 Other specified hypothyroidism: Secondary | ICD-10-CM

## 2014-04-29 LAB — URINALYSIS
BILIRUBIN URINE: NEGATIVE
Hgb urine dipstick: NEGATIVE
KETONES UR: NEGATIVE
Leukocytes, UA: NEGATIVE
Nitrite: NEGATIVE
Specific Gravity, Urine: 1.015 (ref 1.000–1.030)
Total Protein, Urine: NEGATIVE
URINE GLUCOSE: NEGATIVE
UROBILINOGEN UA: 0.2 (ref 0.0–1.0)
pH: 7 (ref 5.0–8.0)

## 2014-04-29 LAB — BASIC METABOLIC PANEL
BUN: 11 mg/dL (ref 6–23)
CO2: 29 mEq/L (ref 19–32)
CREATININE: 0.9 mg/dL (ref 0.4–1.5)
Calcium: 9.4 mg/dL (ref 8.4–10.5)
Chloride: 103 mEq/L (ref 96–112)
GFR: 90.71 mL/min (ref >60.00)
GLUCOSE: 109 mg/dL — AB (ref 70–99)
Potassium: 4.3 mEq/L (ref 3.5–5.1)
Sodium: 139 mEq/L (ref 135–145)

## 2014-04-29 LAB — CBC WITH DIFFERENTIAL/PLATELET
BASOS PCT: 0.4 % (ref 0.0–3.0)
Basophils Absolute: 0 10*3/uL (ref 0.0–0.1)
EOS PCT: 1.6 % (ref 0.0–5.0)
Eosinophils Absolute: 0.2 10*3/uL (ref 0.0–0.7)
HEMATOCRIT: 44 % (ref 39.0–52.0)
Hemoglobin: 14.6 g/dL (ref 13.0–17.0)
LYMPHS ABS: 1.5 10*3/uL (ref 0.7–4.0)
Lymphocytes Relative: 13.7 % (ref 12.0–46.0)
MCHC: 33.1 g/dL (ref 30.0–36.0)
MCV: 90.8 fl (ref 78.0–100.0)
Monocytes Absolute: 0.9 10*3/uL (ref 0.1–1.0)
Monocytes Relative: 8.8 % (ref 3.0–12.0)
Neutro Abs: 8 10*3/uL — ABNORMAL HIGH (ref 1.4–7.7)
Neutrophils Relative %: 75.5 % (ref 43.0–77.0)
Platelets: 263 10*3/uL (ref 150.0–400.0)
RBC: 4.84 Mil/uL (ref 4.22–5.81)
RDW: 13.5 % (ref 11.5–15.5)
WBC: 10.6 10*3/uL — ABNORMAL HIGH (ref 4.0–10.5)

## 2014-04-29 LAB — HEPATIC FUNCTION PANEL
ALT: 25 U/L (ref 0–53)
AST: 23 U/L (ref 0–37)
Albumin: 4.1 g/dL (ref 3.5–5.2)
Alkaline Phosphatase: 69 U/L (ref 39–117)
BILIRUBIN DIRECT: 0.1 mg/dL (ref 0.0–0.3)
BILIRUBIN TOTAL: 0.4 mg/dL (ref 0.2–1.2)
Total Protein: 6.9 g/dL (ref 6.0–8.3)

## 2014-04-29 LAB — LIPID PANEL
Cholesterol: 189 mg/dL (ref 0–200)
HDL: 48.4 mg/dL (ref >39.00)
LDL CALC: 125 mg/dL — AB (ref 0–99)
NonHDL: 140.6
TRIGLYCERIDES: 79 mg/dL (ref 0.0–149.0)
Total CHOL/HDL Ratio: 4
VLDL: 15.8 mg/dL (ref 0.0–40.0)

## 2014-04-29 LAB — TSH: TSH: 1.16 u[IU]/mL (ref 0.35–4.50)

## 2014-04-29 LAB — PSA: PSA: 0.5 ng/mL (ref 0.10–4.00)

## 2014-04-29 NOTE — Progress Notes (Signed)
Pre visit review using our clinic review tool, if applicable. No additional management support is needed unless otherwise documented below in the visit note. 

## 2014-04-29 NOTE — Progress Notes (Signed)
   Subjective:   HPI C/o depression.  Working >50 h/wk. Smoking 1/2 ppd. Wife w/dementia -stressed   The patient presents for a follow-up of  chronic hypertension, chronic dyslipidemia, hypothyroidism controlled with medicines. BP is nl at home  BP Readings from Last 3 Encounters:  04/29/14 120/84  02/27/14 128/84  02/03/14 130/96    Wt Readings from Last 3 Encounters:  04/29/14 195 lb (88.451 kg)  02/27/14 201 lb (91.173 kg)  02/03/14 196 lb 4 oz (89.018 kg)      Review of Systems  Constitutional: Positive for unexpected weight change (10 lbs on diet). Negative for appetite change and fatigue.  HENT: Negative for congestion, nosebleeds, sneezing, sore throat and trouble swallowing.   Eyes: Negative for itching and visual disturbance.  Respiratory: Positive for cough.   Cardiovascular: Negative for chest pain, palpitations and leg swelling.  Gastrointestinal: Negative for nausea, diarrhea, blood in stool and abdominal distention.  Genitourinary: Negative for frequency and hematuria.  Musculoskeletal: Negative for back pain, joint swelling, gait problem and neck pain.  Skin: Negative for rash.  Neurological: Negative for dizziness, tremors, speech difficulty and weakness.  Psychiatric/Behavioral: Negative for suicidal ideas, sleep disturbance, dysphoric mood and agitation. The patient is not nervous/anxious.        Objective:   Physical Exam  Constitutional: He is oriented to person, place, and time. He appears well-developed. No distress.  NAD  HENT:  Mouth/Throat: Oropharynx is clear and moist.  Eyes: Conjunctivae are normal. Pupils are equal, round, and reactive to light.  Neck: Normal range of motion. No JVD present. No thyromegaly present.  Cardiovascular: Normal rate, regular rhythm, normal heart sounds and intact distal pulses.  Exam reveals no gallop and no friction rub.   No murmur heard. Pulmonary/Chest: Effort normal and breath sounds normal. No respiratory  distress. He has no wheezes. He has no rales. He exhibits no tenderness.  Abdominal: Soft. Bowel sounds are normal. He exhibits no distension and no mass. There is no tenderness. There is no rebound and no guarding.  Musculoskeletal: Normal range of motion. He exhibits no edema or tenderness.  Lymphadenopathy:    He has no cervical adenopathy.  Neurological: He is alert and oriented to person, place, and time. He has normal reflexes. No cranial nerve deficit. He exhibits normal muscle tone. He displays a negative Romberg sign. Coordination and gait normal.  No meningeal signs  Skin: Skin is warm and dry. No rash noted.  Psychiatric: His behavior is normal. Judgment and thought content normal.  depressed   Lab Results  Component Value Date   WBC 6.7 03/12/2013   HGB 13.8 03/12/2013   HCT 40.5 03/12/2013   PLT 235.0 03/12/2013   GLUCOSE 99 03/12/2013   CHOL 170 03/12/2013   TRIG 56.0 03/12/2013   HDL 44.90 03/12/2013   LDLDIRECT 135.6 01/25/2012   LDLCALC 114* 03/12/2013   ALT 29 03/12/2013   AST 21 03/12/2013   NA 137 03/12/2013   K 4.8 03/12/2013   CL 102 03/12/2013   CREATININE 0.9 03/12/2013   BUN 12 03/12/2013   CO2 28 03/12/2013   TSH 2.21 03/12/2013   PSA 0.32 03/12/2013   INR 0.9 RATIO 01/28/2007        Assessment & Plan:  Patient ID: [name] [name], male   DOB: 1961/03/25, 53 y.o.   MRN: [id number]

## 2014-04-29 NOTE — Assessment & Plan Note (Signed)
We discussed age appropriate health related issues, including available/recomended screening tests and vaccinations. We discussed a need for adhering to healthy diet and exercise. Labs/EKG were reviewed/ordered. All questions were answered. 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  Labs

## 2014-04-29 NOTE — Assessment & Plan Note (Signed)
Continue with current prescription therapy as reflected on the Med list.  

## 2014-04-29 NOTE — Progress Notes (Signed)
Subjective:   HPI  Well Exam today. 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 He had 15R total so far...  F/u depression - better on Effexor The patient has been doing well overall without major physical or psychological issues going on lately. Working 70 h/wk. Smoking <1/2 ppd. Wife w/dementia -stressed   The patient presents for a follow-up of  chronic hypertension, chronic dyslipidemia, hypothyroidism controlled with medicines. BP is nl at home  BP Readings from Last 3 Encounters:  04/29/14 120/84  02/27/14 128/84  02/03/14 130/96    Wt Readings from Last 3 Encounters:  04/29/14 195 lb (88.451 kg)  02/27/14 201 lb (91.173 kg)  02/03/14 196 lb 4 oz (89.018 kg)      Review of Systems  Constitutional: Positive for unexpected weight change (10 lbs on diet). Negative for appetite change and fatigue.  HENT: Negative for congestion, nosebleeds, sneezing, sore throat and trouble swallowing.   Eyes: Negative for itching and visual disturbance.  Respiratory: Positive for cough.   Cardiovascular: Negative for chest pain, palpitations and leg swelling.  Gastrointestinal: Negative for nausea, diarrhea, blood in stool and abdominal distention.  Genitourinary: Negative for frequency and hematuria.  Musculoskeletal: Negative for back pain, joint swelling, gait problem and neck pain.  Skin: Negative for rash.  Neurological: Negative for dizziness, tremors, speech difficulty and weakness.  Psychiatric/Behavioral: Negative for sleep disturbance, dysphoric mood and agitation. The patient is not nervous/anxious.        Objective:   Physical Exam  Constitutional: He is oriented to person, place, and time. He appears well-developed. No distress.  NAD  HENT:  Mouth/Throat: Oropharynx is clear and moist.  Eyes: Conjunctivae are normal. Pupils are equal, round, and reactive to light.  Neck: Normal range of motion. No JVD present. No thyromegaly present.  Cardiovascular: Normal rate,  regular rhythm, normal heart sounds and intact distal pulses.  Exam reveals no gallop and no friction rub.   No murmur heard. Pulmonary/Chest: Effort normal and breath sounds normal. No respiratory distress. He has no wheezes. He has no rales. He exhibits no tenderness.  Abdominal: Soft. Bowel sounds are normal. He exhibits no distension and no mass. There is no tenderness. There is no rebound and no guarding.  Musculoskeletal: Normal range of motion. He exhibits no edema or tenderness.  Lymphadenopathy:    He has no cervical adenopathy.  Neurological: He is alert and oriented to person, place, and time. He has normal reflexes. No cranial nerve deficit. He exhibits normal muscle tone. He displays a negative Romberg sign. Coordination and gait normal.  No meningeal signs  Skin: Skin is warm and dry. No rash noted.  Psychiatric: He has a normal mood and affect. His behavior is normal. Judgment and thought content normal.  Pt declined rectal exam   Lab Results  Component Value Date   WBC 6.7 03/12/2013   HGB 13.8 03/12/2013   HCT 40.5 03/12/2013   PLT 235.0 03/12/2013   GLUCOSE 99 03/12/2013   CHOL 170 03/12/2013   TRIG 56.0 03/12/2013   HDL 44.90 03/12/2013   LDLDIRECT 135.6 01/25/2012   LDLCALC 114* 03/12/2013   ALT 29 03/12/2013   AST 21 03/12/2013   NA 137 03/12/2013   K 4.8 03/12/2013   CL 102 03/12/2013   CREATININE 0.9 03/12/2013   BUN 12 03/12/2013   CO2 28 03/12/2013   TSH 2.21 03/12/2013   PSA 0.32 03/12/2013   INR 0.9 RATIO 01/28/2007  Assessment & Plan:

## 2014-04-29 NOTE — Assessment & Plan Note (Signed)
Better on current Rx 

## 2014-04-29 NOTE — Assessment & Plan Note (Signed)
He is trying to quit smoking

## 2014-06-29 ENCOUNTER — Encounter: Payer: Self-pay | Admitting: Internal Medicine

## 2014-07-01 ENCOUNTER — Other Ambulatory Visit: Payer: Self-pay | Admitting: Internal Medicine

## 2014-07-01 MED ORDER — VENLAFAXINE HCL ER 150 MG PO CP24: 150.0000 mg | ORAL_CAPSULE | Freq: Every day | ORAL | Status: DC

## 2014-08-23 ENCOUNTER — Other Ambulatory Visit: Payer: Self-pay | Admitting: Internal Medicine

## 2014-08-26 ENCOUNTER — Ambulatory Visit (INDEPENDENT_AMBULATORY_CARE_PROVIDER_SITE_OTHER): Payer: Federal, State, Local not specified - PPO | Admitting: Internal Medicine

## 2014-08-26 ENCOUNTER — Encounter: Payer: Self-pay | Admitting: Internal Medicine

## 2014-08-26 VITALS — BP 130/88 | HR 96 | Wt 201.0 lb

## 2014-08-26 DIAGNOSIS — R5383 Other fatigue: Secondary | ICD-10-CM | POA: Insufficient documentation

## 2014-08-26 DIAGNOSIS — R5382 Chronic fatigue, unspecified: Secondary | ICD-10-CM

## 2014-08-26 DIAGNOSIS — F4323 Adjustment disorder with mixed anxiety and depressed mood: Secondary | ICD-10-CM | POA: Diagnosis not present

## 2014-08-26 MED ORDER — ARMODAFINIL 150 MG PO TABS: 150.0000 mg | ORAL_TABLET | Freq: Every day | ORAL | Status: DC

## 2014-08-26 NOTE — Assessment & Plan Note (Signed)
4/16 shift work disorder. Added [employer]

## 2014-08-26 NOTE — Assessment & Plan Note (Addendum)
On Effexor 4/16 shift work disorder. Added [employer]

## 2014-08-26 NOTE — Progress Notes (Signed)
Subjective:   HPI  F/u depression, fatigue. He sleeps 5 h/night. He works all 3 shifts...  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  F/u depression - better on Effexor 75, no change on 150 mg...  The patient has been doing well overall without major physical or psychological issues going on lately. Working 70 h/wk. Smoking <1/2 ppd. Wife w/dementia -stressed   The patient presents for a follow-up of  chronic hypertension, chronic dyslipidemia, hypothyroidism controlled with medicines. BP is nl at home  BP Readings from Last 3 Encounters:  08/26/14 130/88  04/29/14 120/84  02/27/14 128/84    Wt Readings from Last 3 Encounters:  08/26/14 201 lb (91.173 kg)  04/29/14 195 lb (88.451 kg)  02/27/14 201 lb (91.173 kg)      Review of Systems  Constitutional: Positive for unexpected weight change (10 lbs on diet). Negative for appetite change and fatigue.  HENT: Negative for congestion, nosebleeds, sneezing, sore throat and trouble swallowing.   Eyes: Negative for itching and visual disturbance.  Respiratory: Positive for cough.   Cardiovascular: Negative for chest pain, palpitations and leg swelling.  Gastrointestinal: Negative for nausea, diarrhea, blood in stool and abdominal distention.  Genitourinary: Negative for frequency and hematuria.  Musculoskeletal: Negative for back pain, joint swelling, gait problem and neck pain.  Skin: Negative for rash.  Neurological: Negative for dizziness, tremors, speech difficulty and weakness.  Psychiatric/Behavioral: Negative for sleep disturbance, dysphoric mood and agitation. The patient is not nervous/anxious.        Objective:   Physical Exam  Constitutional: He is oriented to person, place, and time. He appears well-developed. No distress.  NAD  HENT:  Mouth/Throat: Oropharynx is clear and moist.  Eyes: Conjunctivae are normal. Pupils are equal, round, and reactive to light.  Neck:  Normal range of motion. No JVD present. No thyromegaly present.  Cardiovascular: Normal rate, regular rhythm, normal heart sounds and intact distal pulses.  Exam reveals no gallop and no friction rub.   No murmur heard. Pulmonary/Chest: Effort normal and breath sounds normal. No respiratory distress. He has no wheezes. He has no rales. He exhibits no tenderness.  Abdominal: Soft. Bowel sounds are normal. He exhibits no distension and no mass. There is no tenderness. There is no rebound and no guarding.  Musculoskeletal: Normal range of motion. He exhibits no edema or tenderness.  Lymphadenopathy:    He has no cervical adenopathy.  Neurological: He is alert and oriented to person, place, and time. He has normal reflexes. No cranial nerve deficit. He exhibits normal muscle tone. He displays a negative Romberg sign. Coordination and gait normal.  No meningeal signs  Skin: Skin is warm and dry. No rash noted.  Psychiatric: He has a normal mood and affect. His behavior is normal. Judgment and thought content normal.  Pt declined rectal exam   Lab Results  Component Value Date   WBC 10.6* 04/29/2014   HGB 14.6 04/29/2014   HCT 44.0 04/29/2014   PLT 263.0 04/29/2014   GLUCOSE 109* 04/29/2014   CHOL 189 04/29/2014   TRIG 79.0 04/29/2014   HDL 48.40 04/29/2014   LDLDIRECT 135.6 01/25/2012   LDLCALC 125* 04/29/2014   ALT 25 04/29/2014   AST 23 04/29/2014   NA 139 04/29/2014   K 4.3 04/29/2014   CL 103 04/29/2014   CREATININE 0.9 04/29/2014   BUN 11 04/29/2014   CO2 29 04/29/2014   TSH 1.16 04/29/2014  PSA 0.50 04/29/2014   INR 0.9 RATIO 01/28/2007         Assessment & Plan:

## 2014-08-26 NOTE — Progress Notes (Signed)
Pre visit review using our clinic review tool, if applicable. No additional management support is needed unless otherwise documented below in the visit note. 

## 2014-09-02 ENCOUNTER — Ambulatory Visit: Payer: BC Managed Care – PPO | Admitting: Internal Medicine

## 2014-10-19 ENCOUNTER — Other Ambulatory Visit: Payer: Self-pay | Admitting: Internal Medicine

## 2014-10-27 ENCOUNTER — Other Ambulatory Visit: Payer: Self-pay | Admitting: Internal Medicine

## 2014-11-25 ENCOUNTER — Encounter: Payer: Self-pay | Admitting: Internal Medicine

## 2014-11-25 ENCOUNTER — Ambulatory Visit (INDEPENDENT_AMBULATORY_CARE_PROVIDER_SITE_OTHER): Payer: Federal, State, Local not specified - PPO | Admitting: Internal Medicine

## 2014-11-25 VITALS — BP 140/98 | HR 91 | Wt 202.0 lb

## 2014-11-25 DIAGNOSIS — J439 Emphysema, unspecified: Secondary | ICD-10-CM

## 2014-11-25 DIAGNOSIS — F4323 Adjustment disorder with mixed anxiety and depressed mood: Secondary | ICD-10-CM

## 2014-11-25 DIAGNOSIS — E038 Other specified hypothyroidism: Secondary | ICD-10-CM

## 2014-11-25 DIAGNOSIS — M6740 Ganglion, unspecified site: Secondary | ICD-10-CM

## 2014-11-25 DIAGNOSIS — Z720 Tobacco use: Secondary | ICD-10-CM | POA: Diagnosis not present

## 2014-11-25 DIAGNOSIS — E034 Atrophy of thyroid (acquired): Secondary | ICD-10-CM

## 2014-11-25 MED ORDER — METHYLPREDNISOLONE ACETATE 40 MG/ML IJ SUSP
10.0000 mg | Freq: Once | INTRAMUSCULAR | Status: AC
  Administered 2014-11-25: 10 mg via INTRALESIONAL

## 2014-11-25 MED ORDER — UMECLIDINIUM-VILANTEROL 62.5-25 MCG/INH IN AEPB: 1.0000 | INHALATION_SPRAY | Freq: Every day | RESPIRATORY_TRACT | Status: DC

## 2014-11-25 NOTE — Patient Instructions (Signed)
Take it easy for a day or two. Use ice 20 min four times a day x 24 hrs. Call if problems.

## 2014-11-25 NOTE — Assessment & Plan Note (Signed)
Anoro, Proair prn Stop smoking

## 2014-11-25 NOTE — Assessment & Plan Note (Signed)
Flat tender dorsal cyst at the base of the r thumb 1 cm See Procedure

## 2014-11-25 NOTE — Assessment & Plan Note (Signed)
Smoking less 

## 2014-11-25 NOTE — Progress Notes (Signed)
Pre visit review using our clinic review tool, if applicable. No additional management support is needed unless otherwise documented below in the visit note. 

## 2014-11-25 NOTE — Assessment & Plan Note (Signed)
Chronic On Effexor 

## 2014-11-25 NOTE — Progress Notes (Signed)
Subjective:  Patient ID: [name] [name], male    DOB: [birth year]  Age: 54 y.o. MRN: [id number]  CC: No chief complaint on file.   HPI [name] [name] presents for asthma, hypothyroidism, dyslipidemia f/u C/o painful lump at R thumb base  Outpatient Prescriptions Prior to Visit  Medication Sig Dispense Refill  . Albuterol Sulfate (PROAIR RESPICLICK) 108 (90 BASE) MCG/ACT AEPB Inhale 1-2 puffs into the lungs 4 (four) times daily as needed. 1 each 11  . Armodafinil 150 MG tablet Take 1 tablet (150 mg total) by mouth daily. 30 tablet 5  . cholecalciferol (VITAMIN D) 1000 UNITS tablet Take 1,000 Units by mouth daily.      Marland Kitchen levothyroxine (SYNTHROID, LEVOTHROID) 137 MCG tablet TAKE ONE TABLET BY MOUTH ONE TIME DAILY  90 tablet 1  . meloxicam (MOBIC) 15 MG tablet Take 15 mg by mouth daily.      Marland Kitchen omeprazole (PRILOSEC) 40 MG capsule TAKE ONE CAPSULE BY MOUTH ONE TIME DAILY 30 capsule 11  . simvastatin (ZOCOR) 40 MG tablet TAKE ONE TABLET BY MOUTH ONE TIME DAILY 90 tablet 2  . Umeclidinium-Vilanterol (ANORO ELLIPTA) 62.5-25 MCG/INH AEPB Inhale 1 Act into the lungs daily. 1 each 11  . venlafaxine XR (EFFEXOR-XR) 75 MG 24 hr capsule TAKE ONE CAPSULE BY MOUTH ONE TIME DAILY WITH BREAKFAST 30 capsule 11  . venlafaxine XR (EFFEXOR XR) 150 MG 24 hr capsule Take 1 capsule (150 mg total) by mouth daily with breakfast. 30 capsule 5  . doxycycline (VIBRA-TABS) 100 MG tablet Take 1 tablet by mouth 2 (two) times daily.  0   No facility-administered medications prior to visit.    ROS Review of Systems  Constitutional: Negative for appetite change, fatigue and unexpected weight change.  HENT: Negative for congestion, nosebleeds, sneezing, sore throat and trouble swallowing.   Eyes: Negative for itching and visual disturbance.  Respiratory: Negative for cough.   Cardiovascular: Negative for chest pain, palpitations and leg swelling.  Gastrointestinal: Negative for nausea, diarrhea, blood in stool and  abdominal distention.  Genitourinary: Negative for frequency and hematuria.  Musculoskeletal: Positive for arthralgias. Negative for back pain, joint swelling, gait problem and neck pain.  Skin: Negative for rash.  Neurological: Negative for dizziness, tremors, speech difficulty and weakness.  Psychiatric/Behavioral: Negative for sleep disturbance, dysphoric mood and agitation. The patient is not nervous/anxious.     Objective:  BP 140/98 mmHg  Pulse 91  Wt 202 lb (91.627 kg)  SpO2 95%  BP Readings from Last 3 Encounters:  11/25/14 140/98  08/26/14 130/88  04/29/14 120/84    Wt Readings from Last 3 Encounters:  11/25/14 202 lb (91.627 kg)  08/26/14 201 lb (91.173 kg)  04/29/14 195 lb (88.451 kg)    Physical Exam  Constitutional: He is oriented to person, place, and time. He appears well-developed. No distress.  NAD  HENT:  Mouth/Throat: Oropharynx is clear and moist.  Eyes: Conjunctivae are normal. Pupils are equal, round, and reactive to light.  Neck: Normal range of motion. No JVD present. No thyromegaly present.  Cardiovascular: Normal rate, regular rhythm, normal heart sounds and intact distal pulses.  Exam reveals no gallop and no friction rub.   No murmur heard. Pulmonary/Chest: Effort normal and breath sounds normal. No respiratory distress. He has no wheezes. He has no rales. He exhibits no tenderness.  Abdominal: Soft. Bowel sounds are normal. He exhibits no distension and no mass. There is no tenderness. There is no rebound and no guarding.  Musculoskeletal: Normal range of motion. He exhibits tenderness. He exhibits no edema.  Lymphadenopathy:    He has no cervical adenopathy.  Neurological: He is alert and oriented to person, place, and time. He has normal reflexes. No cranial nerve deficit. He exhibits normal muscle tone. He displays a negative Romberg sign. Coordination and gait normal.  Skin: Skin is warm and dry. No rash noted.  Psychiatric: He has a normal  mood and affect. His behavior is normal. Judgment and thought content normal.  Flat tender dorsal cyst at the base of the r thumb 1 cm  Lab Results  Component Value Date   WBC 10.6* 04/29/2014   HGB 14.6 04/29/2014   HCT 44.0 04/29/2014   PLT 263.0 04/29/2014   GLUCOSE 109* 04/29/2014   CHOL 189 04/29/2014   TRIG 79.0 04/29/2014   HDL 48.40 04/29/2014   LDLDIRECT 135.6 01/25/2012   LDLCALC 125* 04/29/2014   ALT 25 04/29/2014   AST 23 04/29/2014   NA 139 04/29/2014   K 4.3 04/29/2014   CL 103 04/29/2014   CREATININE 0.9 04/29/2014   BUN 11 04/29/2014   CO2 29 04/29/2014   TSH 1.16 04/29/2014   PSA 0.50 04/29/2014   INR 0.9 RATIO 01/28/2007    Dg Chest 2 View  10/27/2013   CLINICAL DATA:  Cough and shortness of breath for 2 weeks; history of smoking  EXAM: CHEST  2 VIEW  COMPARISON:  PA and lateral chest x-ray of December 25, 2006 and CT scan of the chest of June 03, 2009.  FINDINGS: The lungs are mildly hyperinflated. There is no focal infiltrate. The heart and mediastinal structures are normal. There is no pleural effusion or pneumothorax. The bony thorax is unremarkable.  IMPRESSION: There is stable emphysematous changes bilaterally. There is no acute cardiopulmonary abnormality.   Electronically Signed   By: David  [place]   On: 10/27/2013 17:27         Procedure:  Cyst aspiration  Indication:  Growing cyst aspiration and steroid injection  Risks including bleeding, infection, relapse and others as well as benefits were explained to the patient in detail.  Skin was prepped with Betadine and alcohol and injected  10 mg of Depo-medrol and 0.5 cc of 2 % Lidocaine. Bandaid. .  Tolerated well. Complications none. Instructions provided.  Instructions:  Take it easy for a day or two. Use ice 20 min four times a day x 24 hrs. Call if problems.    Assessment & Plan:   There are no diagnoses linked to this encounter. I am having [name] maintain his meloxicam,  cholecalciferol, Albuterol Sulfate, Umeclidinium-Vilanterol, levothyroxine, simvastatin, doxycycline, Armodafinil, venlafaxine XR, and omeprazole.  No orders of the defined types were placed in this encounter.     Follow-up: No Follow-up on file.  Sonda Primes, MD

## 2014-11-25 NOTE — Assessment & Plan Note (Signed)
Chronic On Levothroid - 

## 2014-12-31 ENCOUNTER — Other Ambulatory Visit: Payer: Self-pay | Admitting: Internal Medicine

## 2015-03-10 ENCOUNTER — Telehealth: Payer: Self-pay | Admitting: Family

## 2015-03-10 DIAGNOSIS — J019 Acute sinusitis, unspecified: Secondary | ICD-10-CM

## 2015-03-10 MED ORDER — FLUTICASONE PROPIONATE 50 MCG/ACT NA SUSP: 2.0000 | Freq: Every day | NASAL | Status: DC

## 2015-03-10 MED ORDER — AMOXICILLIN-POT CLAVULANATE 875-125 MG PO TABS: 1.0000 | ORAL_TABLET | Freq: Two times a day (BID) | ORAL | Status: DC

## 2015-03-10 NOTE — Progress Notes (Signed)

## 2015-03-16 ENCOUNTER — Other Ambulatory Visit: Payer: Self-pay | Admitting: *Deleted

## 2015-03-16 MED ORDER — ARMODAFINIL 150 MG PO TABS: 150.0000 mg | ORAL_TABLET | Freq: Every day | ORAL | Status: DC

## 2015-03-16 NOTE — Telephone Encounter (Signed)
Fax Rx to [facility] [phone]

## 2015-03-29 ENCOUNTER — Encounter: Payer: Self-pay | Admitting: Internal Medicine

## 2015-03-29 ENCOUNTER — Ambulatory Visit (INDEPENDENT_AMBULATORY_CARE_PROVIDER_SITE_OTHER): Payer: Federal, State, Local not specified - PPO | Admitting: Internal Medicine

## 2015-03-29 VITALS — BP 138/88 | HR 90 | Wt 207.0 lb

## 2015-03-29 DIAGNOSIS — Z9989 Dependence on other enabling machines and devices: Secondary | ICD-10-CM | POA: Insufficient documentation

## 2015-03-29 DIAGNOSIS — Z23 Encounter for immunization: Secondary | ICD-10-CM

## 2015-03-29 DIAGNOSIS — E038 Other specified hypothyroidism: Secondary | ICD-10-CM

## 2015-03-29 DIAGNOSIS — R5382 Chronic fatigue, unspecified: Secondary | ICD-10-CM | POA: Diagnosis not present

## 2015-03-29 DIAGNOSIS — E034 Atrophy of thyroid (acquired): Secondary | ICD-10-CM

## 2015-03-29 DIAGNOSIS — R1310 Dysphagia, unspecified: Secondary | ICD-10-CM | POA: Diagnosis not present

## 2015-03-29 DIAGNOSIS — G4733 Obstructive sleep apnea (adult) (pediatric): Secondary | ICD-10-CM | POA: Insufficient documentation

## 2015-03-29 DIAGNOSIS — Z0000 Encounter for general adult medical examination without abnormal findings: Secondary | ICD-10-CM

## 2015-03-29 MED ORDER — ARMODAFINIL 150 MG PO TABS: 150.0000 mg | ORAL_TABLET | ORAL | Status: DC

## 2015-03-29 NOTE — Assessment & Plan Note (Signed)
11/16 Better on CPAP

## 2015-03-29 NOTE — Progress Notes (Signed)
Subjective:  Patient ID: [name] [name], male    DOB: [birth year]  Age: 54 y.o. MRN: [id number]  CC: No chief complaint on file.   HPI [name] [name] presents for hypothyroidism, depression, GERD, shift work disorder f/u. Pt was dx'd w/OSA - on CPAP since 10/16  Outpatient Prescriptions Prior to Visit  Medication Sig Dispense Refill  . Albuterol Sulfate (PROAIR RESPICLICK) 108 (90 BASE) MCG/ACT AEPB Inhale 1-2 puffs into the lungs 4 (four) times daily as needed. 1 each 11  . Armodafinil 150 MG tablet Take 1 tablet (150 mg total) by mouth daily. 30 tablet 5  . cholecalciferol (VITAMIN D) 1000 UNITS tablet Take 1,000 Units by mouth daily.      . fluticasone (FLONASE) 50 MCG/ACT nasal spray Place 2 sprays into both nostrils daily. 16 g 6  . levothyroxine (SYNTHROID, LEVOTHROID) 137 MCG tablet TAKE ONE TABLET BY MOUTH ONE TIME DAILY 90 tablet 3  . meloxicam (MOBIC) 15 MG tablet Take 15 mg by mouth daily.      Marland Kitchen omeprazole (PRILOSEC) 40 MG capsule TAKE ONE CAPSULE BY MOUTH ONE TIME DAILY 30 capsule 11  . simvastatin (ZOCOR) 40 MG tablet TAKE ONE TABLET BY MOUTH ONE TIME DAILY 90 tablet 2  . Umeclidinium-Vilanterol (ANORO ELLIPTA) 62.5-25 MCG/INH AEPB Inhale 1 Act into the lungs daily. 1 each 11  . venlafaxine XR (EFFEXOR-XR) 75 MG 24 hr capsule TAKE ONE CAPSULE BY MOUTH ONE TIME DAILY WITH BREAKFAST 30 capsule 11  . amoxicillin-clavulanate (AUGMENTIN) 875-125 MG tablet Take 1 tablet by mouth 2 (two) times daily. (Patient not taking: Reported on 03/29/2015) 14 tablet 0  . doxycycline (VIBRA-TABS) 100 MG tablet Take 1 tablet by mouth 2 (two) times daily.  0   No facility-administered medications prior to visit.    ROS Review of Systems  Constitutional: Positive for fatigue. Negative for appetite change and unexpected weight change.  HENT: Negative for congestion, nosebleeds, sneezing, sore throat and trouble swallowing.   Eyes: Negative for itching and visual disturbance.  Respiratory:  Negative for cough.   Cardiovascular: Negative for chest pain, palpitations and leg swelling.  Gastrointestinal: Negative for nausea, diarrhea, blood in stool and abdominal distention.  Genitourinary: Negative for frequency and hematuria.  Musculoskeletal: Positive for arthralgias. Negative for back pain, joint swelling, gait problem and neck pain.  Skin: Negative for rash.  Neurological: Negative for dizziness, tremors, speech difficulty and weakness.  Psychiatric/Behavioral: Negative for sleep disturbance, dysphoric mood and agitation. The patient is not nervous/anxious.     Objective:  BP 138/88 mmHg  Pulse 90  Wt 207 lb (93.895 kg)  SpO2 96%  BP Readings from Last 3 Encounters:  03/29/15 138/88  11/25/14 140/98  08/26/14 130/88    Wt Readings from Last 3 Encounters:  03/29/15 207 lb (93.895 kg)  11/25/14 202 lb (91.627 kg)  08/26/14 201 lb (91.173 kg)    Physical Exam  Constitutional: He is oriented to person, place, and time. He appears well-developed. No distress.  NAD  HENT:  Mouth/Throat: Oropharynx is clear and moist.  Eyes: Conjunctivae are normal. Pupils are equal, round, and reactive to light.  Neck: Normal range of motion. No JVD present. No thyromegaly present.  Cardiovascular: Normal rate, regular rhythm, normal heart sounds and intact distal pulses.  Exam reveals no gallop and no friction rub.   No murmur heard. Pulmonary/Chest: Effort normal and breath sounds normal. No respiratory distress. He has no wheezes. He has no rales. He exhibits no tenderness.  Abdominal:  Soft. Bowel sounds are normal. He exhibits no distension and no mass. There is no tenderness. There is no rebound and no guarding.  Musculoskeletal: Normal range of motion. He exhibits tenderness. He exhibits no edema.  Lymphadenopathy:    He has no cervical adenopathy.  Neurological: He is alert and oriented to person, place, and time. He has normal reflexes. No cranial nerve deficit. He exhibits  normal muscle tone. He displays a negative Romberg sign. Coordination and gait normal.  Skin: Skin is warm and dry. No rash noted.  Psychiatric: He has a normal mood and affect. His behavior is normal. Judgment and thought content normal.  R MCPs - tender   Lab Results  Component Value Date   WBC 10.6* 04/29/2014   HGB 14.6 04/29/2014   HCT 44.0 04/29/2014   PLT 263.0 04/29/2014   GLUCOSE 109* 04/29/2014   CHOL 189 04/29/2014   TRIG 79.0 04/29/2014   HDL 48.40 04/29/2014   LDLDIRECT 135.6 01/25/2012   LDLCALC 125* 04/29/2014   ALT 25 04/29/2014   AST 23 04/29/2014   NA 139 04/29/2014   K 4.3 04/29/2014   CL 103 04/29/2014   CREATININE 0.9 04/29/2014   BUN 11 04/29/2014   CO2 29 04/29/2014   TSH 1.16 04/29/2014   PSA 0.50 04/29/2014   INR 0.9 RATIO 01/28/2007    Dg Chest 2 View  10/27/2013  CLINICAL DATA:  Cough and shortness of breath for 2 weeks; history of smoking EXAM: CHEST  2 VIEW COMPARISON:  PA and lateral chest x-ray of December 25, 2006 and CT scan of the chest of June 03, 2009. FINDINGS: The lungs are mildly hyperinflated. There is no focal infiltrate. The heart and mediastinal structures are normal. There is no pleural effusion or pneumothorax. The bony thorax is unremarkable. IMPRESSION: There is stable emphysematous changes bilaterally. There is no acute cardiopulmonary abnormality. Electronically Signed   By: David  [place]   On: 10/27/2013 17:27    Assessment & Plan:   There are no diagnoses linked to this encounter. I have discontinued Mr. [name] [name]'s doxycycline and amoxicillin-clavulanate. I am also having him maintain his meloxicam, cholecalciferol, Albuterol Sulfate, simvastatin, venlafaxine XR, omeprazole, Umeclidinium-Vilanterol, levothyroxine, fluticasone, and Armodafinil.  No orders of the defined types were placed in this encounter.     Follow-up: No Follow-up on file.  Sonda PrimesAlex Diem Pagnotta, MD

## 2015-03-29 NOTE — Progress Notes (Signed)
Pre visit review using our clinic review tool, if applicable. No additional management support is needed unless otherwise documented below in the visit note. 

## 2015-03-29 NOTE — Assessment & Plan Note (Signed)
Pt was dx'd w/OSA - on CPAP since 10/16 Nuvigil for shift wok disorder

## 2015-03-29 NOTE — Addendum Note (Signed)
Addended by: Merrilyn PumaSIMMONS, Ayjah Show N on: 03/29/2015 01:29 PM   Modules accepted: Orders

## 2015-03-29 NOTE — Assessment & Plan Note (Signed)
On Levothroid 

## 2015-03-29 NOTE — Assessment & Plan Note (Signed)
On Prilosec

## 2015-05-17 ENCOUNTER — Other Ambulatory Visit: Payer: Self-pay | Admitting: Internal Medicine

## 2015-07-21 ENCOUNTER — Other Ambulatory Visit (INDEPENDENT_AMBULATORY_CARE_PROVIDER_SITE_OTHER): Payer: Federal, State, Local not specified - PPO

## 2015-07-21 DIAGNOSIS — Z0000 Encounter for general adult medical examination without abnormal findings: Secondary | ICD-10-CM

## 2015-07-21 LAB — HEPATIC FUNCTION PANEL
ALT: 24 U/L (ref 0–53)
AST: 20 U/L (ref 0–37)
Albumin: 4.1 g/dL (ref 3.5–5.2)
Alkaline Phosphatase: 70 U/L (ref 39–117)
BILIRUBIN DIRECT: 0.1 mg/dL (ref 0.0–0.3)
BILIRUBIN TOTAL: 0.4 mg/dL (ref 0.2–1.2)
Total Protein: 6.6 g/dL (ref 6.0–8.3)

## 2015-07-21 LAB — CBC WITH DIFFERENTIAL/PLATELET
BASOS PCT: 0.5 % (ref 0.0–3.0)
Basophils Absolute: 0 10*3/uL (ref 0.0–0.1)
EOS PCT: 2.2 % (ref 0.0–5.0)
Eosinophils Absolute: 0.2 10*3/uL (ref 0.0–0.7)
HEMATOCRIT: 41.3 % (ref 39.0–52.0)
HEMOGLOBIN: 13.8 g/dL (ref 13.0–17.0)
LYMPHS PCT: 20.7 % (ref 12.0–46.0)
Lymphs Abs: 1.5 10*3/uL (ref 0.7–4.0)
MCHC: 33.4 g/dL (ref 30.0–36.0)
MCV: 87.9 fl (ref 78.0–100.0)
MONOS PCT: 9.3 % (ref 3.0–12.0)
Monocytes Absolute: 0.7 10*3/uL (ref 0.1–1.0)
NEUTROS ABS: 4.7 10*3/uL (ref 1.4–7.7)
Neutrophils Relative %: 67.3 % (ref 43.0–77.0)
PLATELETS: 257 10*3/uL (ref 150.0–400.0)
RBC: 4.69 Mil/uL (ref 4.22–5.81)
RDW: 13.5 % (ref 11.5–15.5)
WBC: 7 10*3/uL (ref 4.0–10.5)

## 2015-07-21 LAB — LIPID PANEL
CHOL/HDL RATIO: 5
CHOLESTEROL: 182 mg/dL (ref 0–200)
HDL: 39 mg/dL — ABNORMAL LOW (ref >39.00)
LDL CALC: 123 mg/dL — AB (ref 0–99)
NONHDL: 143.18
Triglycerides: 99 mg/dL (ref 0.0–149.0)
VLDL: 19.8 mg/dL (ref 0.0–40.0)

## 2015-07-21 LAB — TSH: TSH: 3.33 u[IU]/mL (ref 0.35–4.50)

## 2015-07-21 LAB — PSA: PSA: 0.31 ng/mL (ref 0.10–4.00)

## 2015-07-21 LAB — URINALYSIS
Bilirubin Urine: NEGATIVE
Hgb urine dipstick: NEGATIVE
Ketones, ur: NEGATIVE
LEUKOCYTES UA: NEGATIVE
Nitrite: NEGATIVE
PH: 7.5 (ref 5.0–8.0)
SPECIFIC GRAVITY, URINE: 1.01 (ref 1.000–1.030)
Total Protein, Urine: NEGATIVE
URINE GLUCOSE: NEGATIVE
UROBILINOGEN UA: 0.2 (ref 0.0–1.0)

## 2015-07-21 LAB — BASIC METABOLIC PANEL
BUN: 16 mg/dL (ref 6–23)
CO2: 30 mEq/L (ref 19–32)
CREATININE: 0.92 mg/dL (ref 0.40–1.50)
Calcium: 9.3 mg/dL (ref 8.4–10.5)
Chloride: 102 mEq/L (ref 96–112)
GFR: 91.42 mL/min (ref >60.00)
GLUCOSE: 116 mg/dL — AB (ref 70–99)
POTASSIUM: 4.3 meq/L (ref 3.5–5.1)
Sodium: 138 mEq/L (ref 135–145)

## 2015-07-27 ENCOUNTER — Ambulatory Visit (INDEPENDENT_AMBULATORY_CARE_PROVIDER_SITE_OTHER): Payer: Federal, State, Local not specified - PPO | Admitting: Internal Medicine

## 2015-07-27 ENCOUNTER — Encounter: Payer: Self-pay | Admitting: Internal Medicine

## 2015-07-27 VITALS — BP 136/100 | HR 92 | Temp 97.8°F | Ht 73.0 in | Wt 207.0 lb

## 2015-07-27 DIAGNOSIS — Z23 Encounter for immunization: Secondary | ICD-10-CM

## 2015-07-27 DIAGNOSIS — E038 Other specified hypothyroidism: Secondary | ICD-10-CM

## 2015-07-27 DIAGNOSIS — Z0000 Encounter for general adult medical examination without abnormal findings: Secondary | ICD-10-CM

## 2015-07-27 DIAGNOSIS — J019 Acute sinusitis, unspecified: Secondary | ICD-10-CM | POA: Insufficient documentation

## 2015-07-27 DIAGNOSIS — E785 Hyperlipidemia, unspecified: Secondary | ICD-10-CM

## 2015-07-27 DIAGNOSIS — R7309 Other abnormal glucose: Secondary | ICD-10-CM | POA: Diagnosis not present

## 2015-07-27 DIAGNOSIS — E034 Atrophy of thyroid (acquired): Secondary | ICD-10-CM

## 2015-07-27 DIAGNOSIS — J0100 Acute maxillary sinusitis, unspecified: Secondary | ICD-10-CM | POA: Diagnosis not present

## 2015-07-27 MED ORDER — ALBUTEROL SULFATE 108 (90 BASE) MCG/ACT IN AEPB: 1.0000 | INHALATION_SPRAY | Freq: Four times a day (QID) | RESPIRATORY_TRACT | Status: DC | PRN

## 2015-07-27 MED ORDER — CEFDINIR 300 MG PO CAPS: 300.0000 mg | ORAL_CAPSULE | Freq: Two times a day (BID) | ORAL | Status: DC

## 2015-07-27 NOTE — Assessment & Plan Note (Signed)
Cefdinir po x 10 d 

## 2015-07-27 NOTE — Progress Notes (Signed)
Pre visit review using our clinic review tool, if applicable. No additional management support is needed unless otherwise documented below in the visit note. 

## 2015-07-27 NOTE — Progress Notes (Signed)
Subjective:  Patient ID: [name] [name], male    DOB: 1960/10/19  Age: 55 y.o. MRN: [id number]  CC: Annual Exam   HPI [name] PeruMichael G Cappuccio presents for a well exam. C/o sinusitis x 2-3 wks C/o elev CBG on labs  Outpatient Prescriptions Prior to Visit  Medication Sig Dispense Refill  . Albuterol Sulfate (PROAIR RESPICLICK) 108 (90 BASE) MCG/ACT AEPB Inhale 1-2 puffs into the lungs 4 (four) times daily as needed. 1 each 11  . Armodafinil 150 MG tablet Take 1 tablet (150 mg total) by mouth every morning. 90 tablet 3  . cholecalciferol (VITAMIN D) 1000 UNITS tablet Take 1,000 Units by mouth daily.      . fluticasone (FLONASE) 50 MCG/ACT nasal spray Place 2 sprays into both nostrils daily. 16 g 6  . levothyroxine (SYNTHROID, LEVOTHROID) 137 MCG tablet TAKE ONE TABLET BY MOUTH ONE TIME DAILY 90 tablet 3  . meloxicam (MOBIC) 15 MG tablet Take 15 mg by mouth daily.      Marland Kitchen. omeprazole (PRILOSEC) 40 MG capsule TAKE ONE CAPSULE BY MOUTH ONE TIME DAILY 30 capsule 11  . simvastatin (ZOCOR) 40 MG tablet TAKE ONE TABLET BY MOUTH ONE TIME DAILY 90 tablet 0  . Umeclidinium-Vilanterol (ANORO ELLIPTA) 62.5-25 MCG/INH AEPB Inhale 1 Act into the lungs daily. 1 each 11  . venlafaxine XR (EFFEXOR-XR) 75 MG 24 hr capsule TAKE ONE CAPSULE BY MOUTH ONE TIME DAILY WITH BREAKFAST 30 capsule 11   No facility-administered medications prior to visit.    ROS Review of Systems  Constitutional: Negative for appetite change, fatigue and unexpected weight change.  HENT: Positive for congestion, postnasal drip, rhinorrhea and sinus pressure. Negative for nosebleeds, sneezing, sore throat and trouble swallowing.   Eyes: Negative for itching and visual disturbance.  Respiratory: Positive for cough.   Cardiovascular: Negative for chest pain, palpitations and leg swelling.  Gastrointestinal: Negative for nausea, diarrhea, blood in stool and abdominal distention.  Genitourinary: Negative for frequency and hematuria.    Musculoskeletal: Negative for back pain, joint swelling, gait problem and neck pain.  Skin: Negative for rash.  Neurological: Negative for dizziness, tremors, speech difficulty and weakness.  Psychiatric/Behavioral: Negative for sleep disturbance, dysphoric mood and agitation. The patient is not nervous/anxious.     Objective:  BP 136/100 mmHg  Pulse 92  Temp(Src) 97.8 F (36.6 C) (Oral)  Ht 6\' 1"  (1.854 m)  Wt 207 lb (93.895 kg)  BMI 27.32 kg/m2  SpO2 97%  BP Readings from Last 3 Encounters:  07/27/15 136/100  03/29/15 138/88  11/25/14 140/98    Wt Readings from Last 3 Encounters:  07/27/15 207 lb (93.895 kg)  03/29/15 207 lb (93.895 kg)  11/25/14 202 lb (91.627 kg)    Physical Exam  Constitutional: He is oriented to person, place, and time. He appears well-developed and well-nourished. No distress.  NAD  HENT:  Head: Normocephalic and atraumatic.  Right Ear: External ear normal.  Left Ear: External ear normal.  Nose: Nose normal.  Mouth/Throat: Oropharynx is clear and moist. No oropharyngeal exudate.  Eyes: Conjunctivae and EOM are normal. Pupils are equal, round, and reactive to light. Right eye exhibits no discharge. Left eye exhibits no discharge. No scleral icterus.  Neck: Normal range of motion. Neck supple. No JVD present. No tracheal deviation present. No thyromegaly present.  Cardiovascular: Normal rate, regular rhythm, normal heart sounds and intact distal pulses.  Exam reveals no gallop and no friction rub.   No murmur heard. Pulmonary/Chest: Effort normal and  breath sounds normal. No stridor. No respiratory distress. He has no wheezes. He has no rales. He exhibits no tenderness.  Abdominal: Soft. Bowel sounds are normal. He exhibits no distension and no mass. There is no tenderness. There is no rebound and no guarding.  Genitourinary: Rectum normal and penis normal. Guaiac negative stool. No penile tenderness.  Musculoskeletal: Normal range of motion. He  exhibits no edema or tenderness.  Lymphadenopathy:    He has no cervical adenopathy.  Neurological: He is alert and oriented to person, place, and time. He has normal reflexes. No cranial nerve deficit. He exhibits normal muscle tone. He displays a negative Romberg sign. Coordination and gait normal.  Skin: Skin is warm and dry. No rash noted. He is not diaphoretic. No erythema. No pallor.  Psychiatric: He has a normal mood and affect. His behavior is normal. Judgment and thought content normal.  prostate 1+ eryth nares  Lab Results  Component Value Date   WBC 7.0 07/21/2015   HGB 13.8 07/21/2015   HCT 41.3 07/21/2015   PLT 257.0 07/21/2015   GLUCOSE 116* 07/21/2015   CHOL 182 07/21/2015   TRIG 99.0 07/21/2015   HDL 39.00* 07/21/2015   LDLDIRECT 135.6 01/25/2012   LDLCALC 123* 07/21/2015   ALT 24 07/21/2015   AST 20 07/21/2015   NA 138 07/21/2015   K 4.3 07/21/2015   CL 102 07/21/2015   CREATININE 0.92 07/21/2015   BUN 16 07/21/2015   CO2 30 07/21/2015   TSH 3.33 07/21/2015   PSA 0.31 07/21/2015   INR 0.9 RATIO 01/28/2007    Dg Chest 2 View  10/27/2013  CLINICAL DATA:  Cough and shortness of breath for 2 weeks; history of smoking EXAM: CHEST  2 VIEW COMPARISON:  PA and lateral chest x-ray of December 25, 2006 and CT scan of the chest of June 03, 2009. FINDINGS: The lungs are mildly hyperinflated. There is no focal infiltrate. The heart and mediastinal structures are normal. There is no pleural effusion or pneumothorax. The bony thorax is unremarkable. IMPRESSION: There is stable emphysematous changes bilaterally. There is no acute cardiopulmonary abnormality. Electronically Signed   By: David  [place]   On: 10/27/2013 17:27    Assessment & Plan:   There are no diagnoses linked to this encounter. I am having Mr. Trueba maintain his meloxicam, cholecalciferol, Albuterol Sulfate, venlafaxine XR, omeprazole, umeclidinium-vilanterol, levothyroxine, fluticasone, Armodafinil, and  simvastatin.  No orders of the defined types were placed in this encounter.     Follow-up: No Follow-up on file.  Sonda Primes, MD

## 2015-07-27 NOTE — Assessment & Plan Note (Signed)
On Simvastatin 

## 2015-07-27 NOTE — Assessment & Plan Note (Addendum)
A1c in 6 mo CBG at home prn check

## 2015-07-27 NOTE — Assessment & Plan Note (Signed)
On Levothroid 

## 2015-07-27 NOTE — Assessment & Plan Note (Signed)
We discussed age appropriate health related issues, including available/recomended screening tests and vaccinations. We discussed a need for adhering to healthy diet and exercise. Labs/EKG were reviewed/ordered. All questions were answered. 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 He had 35R total so far... (Max 5/year)

## 2015-08-11 ENCOUNTER — Other Ambulatory Visit: Payer: Self-pay | Admitting: Internal Medicine

## 2015-09-02 DIAGNOSIS — L821 Other seborrheic keratosis: Secondary | ICD-10-CM | POA: Diagnosis not present

## 2015-09-02 DIAGNOSIS — D1801 Hemangioma of skin and subcutaneous tissue: Secondary | ICD-10-CM | POA: Diagnosis not present

## 2015-09-02 DIAGNOSIS — D3617 Benign neoplasm of peripheral nerves and autonomic nervous system of trunk, unspecified: Secondary | ICD-10-CM | POA: Diagnosis not present

## 2015-09-21 ENCOUNTER — Other Ambulatory Visit: Payer: Self-pay

## 2015-09-21 MED ORDER — ARMODAFINIL 150 MG PO TABS: 150.0000 mg | ORAL_TABLET | ORAL | Status: DC

## 2015-09-21 NOTE — Telephone Encounter (Signed)
Recd faxed rx request for armodafinil, printed and waiting for plot to sign

## 2015-09-21 NOTE — Telephone Encounter (Signed)
rx faxed to [facility]

## 2015-09-23 DIAGNOSIS — S83241A Other tear of medial meniscus, current injury, right knee, initial encounter: Secondary | ICD-10-CM | POA: Diagnosis not present

## 2015-09-28 ENCOUNTER — Other Ambulatory Visit: Payer: Self-pay | Admitting: *Deleted

## 2015-09-28 ENCOUNTER — Encounter: Payer: Self-pay | Admitting: Internal Medicine

## 2015-09-28 MED ORDER — ARMODAFINIL 150 MG PO TABS: 150.0000 mg | ORAL_TABLET | ORAL | Status: DC

## 2015-10-25 ENCOUNTER — Other Ambulatory Visit: Payer: Self-pay | Admitting: Internal Medicine

## 2015-10-25 ENCOUNTER — Other Ambulatory Visit: Payer: Self-pay | Admitting: *Deleted

## 2015-10-25 MED ORDER — OMEPRAZOLE 40 MG PO CPDR: 40.0000 mg | DELAYED_RELEASE_CAPSULE | Freq: Every day | ORAL | Status: DC

## 2015-12-14 ENCOUNTER — Other Ambulatory Visit: Payer: Self-pay | Admitting: Internal Medicine

## 2015-12-24 ENCOUNTER — Other Ambulatory Visit: Payer: Self-pay | Admitting: Internal Medicine

## 2016-01-04 ENCOUNTER — Encounter: Payer: Self-pay | Admitting: Internal Medicine

## 2016-01-04 ENCOUNTER — Telehealth: Payer: Self-pay

## 2016-01-04 NOTE — Telephone Encounter (Signed)
PA initiated and APPROVED via CoverMyMeds key [id number] through 01/03/2017. Pt advised via MyChart

## 2016-01-05 ENCOUNTER — Other Ambulatory Visit: Payer: Self-pay | Admitting: *Deleted

## 2016-01-05 MED ORDER — ARMODAFINIL 150 MG PO TABS: 150.0000 mg | ORAL_TABLET | ORAL | 3 refills | Status: DC

## 2016-01-17 ENCOUNTER — Other Ambulatory Visit (INDEPENDENT_AMBULATORY_CARE_PROVIDER_SITE_OTHER): Payer: Federal, State, Local not specified - PPO

## 2016-01-17 DIAGNOSIS — E038 Other specified hypothyroidism: Secondary | ICD-10-CM | POA: Diagnosis not present

## 2016-01-17 DIAGNOSIS — R7309 Other abnormal glucose: Secondary | ICD-10-CM | POA: Diagnosis not present

## 2016-01-17 DIAGNOSIS — E034 Atrophy of thyroid (acquired): Secondary | ICD-10-CM | POA: Diagnosis not present

## 2016-01-17 LAB — BASIC METABOLIC PANEL
BUN: 13 mg/dL (ref 6–23)
CHLORIDE: 104 meq/L (ref 96–112)
CO2: 31 mEq/L (ref 19–32)
Calcium: 8.9 mg/dL (ref 8.4–10.5)
Creatinine, Ser: 0.87 mg/dL (ref 0.40–1.50)
GFR: 97.32 mL/min (ref >60.00)
Glucose, Bld: 95 mg/dL (ref 70–99)
POTASSIUM: 4.1 meq/L (ref 3.5–5.1)
SODIUM: 139 meq/L (ref 135–145)

## 2016-01-17 LAB — HEMOGLOBIN A1C: HEMOGLOBIN A1C: 6.3 % (ref 4.6–6.5)

## 2016-01-17 LAB — TSH: TSH: 2.55 u[IU]/mL (ref 0.35–4.50)

## 2016-01-20 ENCOUNTER — Ambulatory Visit (INDEPENDENT_AMBULATORY_CARE_PROVIDER_SITE_OTHER): Payer: Federal, State, Local not specified - PPO | Admitting: Internal Medicine

## 2016-01-20 ENCOUNTER — Encounter: Payer: Self-pay | Admitting: Internal Medicine

## 2016-01-20 DIAGNOSIS — E034 Atrophy of thyroid (acquired): Secondary | ICD-10-CM

## 2016-01-20 DIAGNOSIS — E038 Other specified hypothyroidism: Secondary | ICD-10-CM | POA: Diagnosis not present

## 2016-01-20 DIAGNOSIS — J439 Emphysema, unspecified: Secondary | ICD-10-CM

## 2016-01-20 DIAGNOSIS — Z23 Encounter for immunization: Secondary | ICD-10-CM

## 2016-01-20 DIAGNOSIS — R7309 Other abnormal glucose: Secondary | ICD-10-CM

## 2016-01-20 DIAGNOSIS — E785 Hyperlipidemia, unspecified: Secondary | ICD-10-CM | POA: Diagnosis not present

## 2016-01-20 MED ORDER — ALBUTEROL SULFATE 108 (90 BASE) MCG/ACT IN AEPB: 1.0000 | INHALATION_SPRAY | Freq: Four times a day (QID) | RESPIRATORY_TRACT | 5 refills | Status: DC | PRN

## 2016-01-20 NOTE — Assessment & Plan Note (Signed)
On Simvastatin 

## 2016-01-20 NOTE — Progress Notes (Signed)
Subjective:  Patient ID: [name] [name], male    DOB: 26-Jan-1961  Age: 55 y.o. MRN: [id number]  CC: No chief complaint on file.   HPI [name] [name] presents for hypothyroidism, OA, dyslipidemia f/u  Outpatient Medications Prior to Visit  Medication Sig Dispense Refill  . Albuterol Sulfate (PROAIR RESPICLICK) 108 (90 Base) MCG/ACT AEPB Inhale 1-2 puffs into the lungs 4 (four) times daily as needed. 1 each 11  . ANORO ELLIPTA 62.5-25 MCG/INH AEPB INHALE ONE ACTUATION INTO THE LUNGS DAILY 60 each 3  . Armodafinil 150 MG tablet Take 1 tablet (150 mg total) by mouth every morning. 90 tablet 3  . cholecalciferol (VITAMIN D) 1000 UNITS tablet Take 1,000 Units by mouth daily.      . fluticasone (FLONASE) 50 MCG/ACT nasal spray Place 2 sprays into both nostrils daily. 16 g 6  . levothyroxine (SYNTHROID, LEVOTHROID) 137 MCG tablet TAKE ONE TABLET BY MOUTH ONE TIME DAILY 90 tablet 2  . meloxicam (MOBIC) 15 MG tablet Take 15 mg by mouth daily.      Marland Kitchen. omeprazole (PRILOSEC) 40 MG capsule Take 1 capsule (40 mg total) by mouth daily. 90 capsule 2  . simvastatin (ZOCOR) 40 MG tablet TAKE ONE TABLET BY MOUTH ONE TIME DAILY 90 tablet 3  . venlafaxine XR (EFFEXOR-XR) 75 MG 24 hr capsule TAKE ONE CAPSULE BY MOUTH ONE TIME DAILY WITH BREAKFAST 90 capsule 2  . cefdinir (OMNICEF) 300 MG capsule Take 1 capsule (300 mg total) by mouth 2 (two) times daily. (Patient not taking: Reported on 01/20/2016) 20 capsule 0   No facility-administered medications prior to visit.     ROS Review of Systems  Constitutional: Negative for appetite change, fatigue and unexpected weight change.  HENT: Negative for congestion, nosebleeds, sneezing, sore throat and trouble swallowing.   Eyes: Negative for itching and visual disturbance.  Respiratory: Negative for cough.   Cardiovascular: Negative for chest pain, palpitations and leg swelling.  Gastrointestinal: Negative for abdominal distention, blood in stool, diarrhea and  nausea.  Genitourinary: Negative for frequency and hematuria.  Musculoskeletal: Negative for back pain, gait problem, joint swelling and neck pain.  Skin: Negative for rash.  Neurological: Negative for dizziness, tremors, speech difficulty and weakness.  Psychiatric/Behavioral: Negative for agitation, dysphoric mood, self-injury and sleep disturbance. The patient is not nervous/anxious.     Objective:  BP 130/80   Pulse 86   Temp 97.9 F (36.6 C) (Oral)   Wt 213 lb (96.6 kg)   SpO2 97%   BMI 28.10 kg/m   BP Readings from Last 3 Encounters:  01/20/16 130/80  07/27/15 (!) 136/100  03/29/15 138/88    Wt Readings from Last 3 Encounters:  01/20/16 213 lb (96.6 kg)  07/27/15 207 lb (93.9 kg)  03/29/15 207 lb (93.9 kg)    Physical Exam  Constitutional: He is oriented to person, place, and time. He appears well-developed. No distress.  NAD  HENT:  Mouth/Throat: Oropharynx is clear and moist.  Eyes: Conjunctivae are normal. Pupils are equal, round, and reactive to light.  Neck: Normal range of motion. No JVD present. No thyromegaly present.  Cardiovascular: Normal rate, regular rhythm, normal heart sounds and intact distal pulses.  Exam reveals no gallop and no friction rub.   No murmur heard. Pulmonary/Chest: Effort normal and breath sounds normal. No respiratory distress. He has no wheezes. He has no rales. He exhibits no tenderness.  Abdominal: Soft. Bowel sounds are normal. He exhibits no distension and no mass.  There is no tenderness. There is no rebound and no guarding.  Musculoskeletal: Normal range of motion. He exhibits no edema or tenderness.  Lymphadenopathy:    He has no cervical adenopathy.  Neurological: He is alert and oriented to person, place, and time. He has normal reflexes. No cranial nerve deficit. He exhibits normal muscle tone. He displays a negative Romberg sign. Coordination and gait normal.  Skin: Skin is warm and dry. No rash noted.  Psychiatric: He has  a normal mood and affect. His behavior is normal. Judgment and thought content normal.  R WRIST IS IN A BRACE  Lab Results  Component Value Date   WBC 7.0 07/21/2015   HGB 13.8 07/21/2015   HCT 41.3 07/21/2015   PLT 257.0 07/21/2015   GLUCOSE 95 01/17/2016   CHOL 182 07/21/2015   TRIG 99.0 07/21/2015   HDL 39.00 (L) 07/21/2015   LDLDIRECT 135.6 01/25/2012   LDLCALC 123 (H) 07/21/2015   ALT 24 07/21/2015   AST 20 07/21/2015   NA 139 01/17/2016   K 4.1 01/17/2016   CL 104 01/17/2016   CREATININE 0.87 01/17/2016   BUN 13 01/17/2016   CO2 31 01/17/2016   TSH 2.55 01/17/2016   PSA 0.31 07/21/2015   INR 0.9 RATIO 01/28/2007   HGBA1C 6.3 01/17/2016    Dg Chest 2 View  Result Date: 10/27/2013 CLINICAL DATA:  Cough and shortness of breath for 2 weeks; history of smoking EXAM: CHEST  2 VIEW COMPARISON:  PA and lateral chest x-ray of December 25, 2006 and CT scan of the chest of June 03, 2009. FINDINGS: The lungs are mildly hyperinflated. There is no focal infiltrate. The heart and mediastinal structures are normal. There is no pleural effusion or pneumothorax. The bony thorax is unremarkable. IMPRESSION: There is stable emphysematous changes bilaterally. There is no acute cardiopulmonary abnormality. Electronically Signed   By: David  [place]   On: 10/27/2013 17:27    Assessment & Plan:   There are no diagnoses linked to this encounter. I have discontinued [name] [name] cefdinir. I am also having him maintain his meloxicam, cholecalciferol, fluticasone, Albuterol Sulfate, simvastatin, venlafaxine XR, omeprazole, ANORO ELLIPTA, levothyroxine, and Armodafinil.  No orders of the defined types were placed in this encounter.    Follow-up: No Follow-up on file.  Sonda Primes, MD

## 2016-01-20 NOTE — Assessment & Plan Note (Signed)
A1c q 6 mo

## 2016-01-20 NOTE — Progress Notes (Signed)
Pre visit review using our clinic review tool, if applicable. No additional management support is needed unless otherwise documented below in the visit note. 

## 2016-01-20 NOTE — Addendum Note (Signed)
Addended by: Merrilyn PumaSIMMONS, Amarie Tarte N on: 01/20/2016 10:03 AM   Modules accepted: Orders

## 2016-01-20 NOTE — Assessment & Plan Note (Signed)
On Levothroid 

## 2016-01-20 NOTE — Assessment & Plan Note (Signed)
Anoro, Proair prn

## 2016-01-27 ENCOUNTER — Ambulatory Visit: Payer: Self-pay | Admitting: Internal Medicine

## 2016-04-20 ENCOUNTER — Ambulatory Visit (INDEPENDENT_AMBULATORY_CARE_PROVIDER_SITE_OTHER)
Admission: RE | Admit: 2016-04-20 | Discharge: 2016-04-20 | Disposition: A | Discharge status: Home / Self Care | Payer: Federal, State, Local not specified - PPO | Source: Ambulatory Visit | Attending: Internal Medicine | Admitting: Internal Medicine

## 2016-04-20 ENCOUNTER — Ambulatory Visit (INDEPENDENT_AMBULATORY_CARE_PROVIDER_SITE_OTHER): Payer: Federal, State, Local not specified - PPO | Admitting: Internal Medicine

## 2016-04-20 ENCOUNTER — Encounter: Payer: Self-pay | Admitting: Internal Medicine

## 2016-04-20 DIAGNOSIS — J439 Emphysema, unspecified: Secondary | ICD-10-CM

## 2016-04-20 DIAGNOSIS — J0100 Acute maxillary sinusitis, unspecified: Secondary | ICD-10-CM

## 2016-04-20 DIAGNOSIS — R05 Cough: Secondary | ICD-10-CM | POA: Diagnosis not present

## 2016-04-20 MED ORDER — CEFDINIR 300 MG PO CAPS: 300.0000 mg | ORAL_CAPSULE | Freq: Two times a day (BID) | ORAL | 0 refills | Status: DC

## 2016-04-20 MED ORDER — METHYLPREDNISOLONE ACETATE 80 MG/ML IJ SUSP
80.0000 mg | Freq: Once | INTRAMUSCULAR | Status: AC
  Administered 2016-04-20: 80 mg via INTRAMUSCULAR

## 2016-04-20 NOTE — Progress Notes (Signed)
Pre visit review using our clinic review tool, if applicable. No additional management support is needed unless otherwise documented below in the visit note. 

## 2016-04-20 NOTE — Assessment & Plan Note (Signed)
Cefdinir Depo-medrol 80 mg IM

## 2016-04-20 NOTE — Progress Notes (Signed)
Subjective:  Patient ID: [name] [name], male    DOB: 17-Aug-1960  Age: 55 y.o. MRN: [id number]  CC: No chief complaint on file.   Cough  This is a chronic problem. The current episode started more than 1 month ago. The problem has been waxing and waning. The problem occurs every few hours. The cough is productive of purulent sputum. Associated symptoms include nasal congestion, a sore throat, shortness of breath and wheezing. Pertinent negatives include no chest pain or chills. He has tried OTC cough suppressant for the symptoms. The treatment provided no relief. His past medical history is significant for asthma and COPD.   [name] [name] presents for bronchitis since October  Outpatient Medications Prior to Visit  Medication Sig Dispense Refill  . Albuterol Sulfate (PROAIR RESPICLICK) 108 (90 Base) MCG/ACT AEPB Inhale 1-2 puffs into the lungs 4 (four) times daily as needed. 1 each 5  . ANORO ELLIPTA 62.5-25 MCG/INH AEPB INHALE ONE ACTUATION INTO THE LUNGS DAILY 60 each 3  . Armodafinil 150 MG tablet Take 1 tablet (150 mg total) by mouth every morning. 90 tablet 3  . cholecalciferol (VITAMIN D) 1000 UNITS tablet Take 1,000 Units by mouth daily.      Marland Kitchen. levothyroxine (SYNTHROID, LEVOTHROID) 137 MCG tablet TAKE ONE TABLET BY MOUTH ONE TIME DAILY 90 tablet 2  . meloxicam (MOBIC) 15 MG tablet Take 15 mg by mouth daily.      Marland Kitchen. omeprazole (PRILOSEC) 40 MG capsule Take 1 capsule (40 mg total) by mouth daily. 90 capsule 2  . simvastatin (ZOCOR) 40 MG tablet TAKE ONE TABLET BY MOUTH ONE TIME DAILY 90 tablet 3  . fluticasone (FLONASE) 50 MCG/ACT nasal spray Place 2 sprays into both nostrils daily. 16 g 6  . venlafaxine XR (EFFEXOR-XR) 75 MG 24 hr capsule TAKE ONE CAPSULE BY MOUTH ONE TIME DAILY WITH BREAKFAST 90 capsule 2   No facility-administered medications prior to visit.     ROS Review of Systems  Constitutional: Negative for chills.  HENT: Positive for congestion, sinus pressure and  sore throat.   Respiratory: Positive for cough, shortness of breath and wheezing.   Cardiovascular: Negative for chest pain.    Objective:  BP (!) 148/90   Pulse 80   Temp 97.6 F (36.4 C) (Oral)   Resp 18   Ht 6\' 2"  (1.88 m)   Wt 214 lb (97.1 kg)   SpO2 96%   BMI 27.48 kg/m   BP Readings from Last 3 Encounters:  04/20/16 (!) 148/90  01/20/16 130/80  07/27/15 (!) 136/100    Wt Readings from Last 3 Encounters:  04/20/16 214 lb (97.1 kg)  01/20/16 213 lb (96.6 kg)  07/27/15 207 lb (93.9 kg)    Physical Exam  Constitutional: He is oriented to person, place, and time. He appears well-developed. No distress.  NAD  HENT:  Mouth/Throat: Oropharynx is clear and moist.  Eyes: Conjunctivae are normal. Pupils are equal, round, and reactive to light.  Neck: Normal range of motion. No JVD present. No thyromegaly present.  Cardiovascular: Normal rate, regular rhythm, normal heart sounds and intact distal pulses.  Exam reveals no gallop and no friction rub.   No murmur heard. Pulmonary/Chest: Effort normal and breath sounds normal. No respiratory distress. He has no wheezes. He has no rales. He exhibits no tenderness.  Abdominal: Soft. Bowel sounds are normal. He exhibits no distension and no mass. There is no tenderness. There is no rebound and no guarding.  Musculoskeletal:  Normal range of motion. He exhibits no edema or tenderness.  Lymphadenopathy:    He has no cervical adenopathy.  Neurological: He is alert and oriented to person, place, and time. He has normal reflexes. No cranial nerve deficit. He exhibits normal muscle tone. He displays a negative Romberg sign. Coordination and gait normal.  Skin: Skin is warm and dry. No rash noted.  Psychiatric: He has a normal mood and affect. His behavior is normal. Judgment and thought content normal.  eryth nasal passages  Lab Results  Component Value Date   WBC 7.0 07/21/2015   HGB 13.8 07/21/2015   HCT 41.3 07/21/2015   PLT 257.0  07/21/2015   GLUCOSE 95 01/17/2016   CHOL 182 07/21/2015   TRIG 99.0 07/21/2015   HDL 39.00 (L) 07/21/2015   LDLDIRECT 135.6 01/25/2012   LDLCALC 123 (H) 07/21/2015   ALT 24 07/21/2015   AST 20 07/21/2015   NA 139 01/17/2016   K 4.1 01/17/2016   CL 104 01/17/2016   CREATININE 0.87 01/17/2016   BUN 13 01/17/2016   CO2 31 01/17/2016   TSH 2.55 01/17/2016   PSA 0.31 07/21/2015   INR 0.9 RATIO 01/28/2007   HGBA1C 6.3 01/17/2016    Dg Chest 2 View  Result Date: 10/27/2013 CLINICAL DATA:  Cough and shortness of breath for 2 weeks; history of smoking EXAM: CHEST  2 VIEW COMPARISON:  PA and lateral chest x-ray of December 25, 2006 and CT scan of the chest of June 03, 2009. FINDINGS: The lungs are mildly hyperinflated. There is no focal infiltrate. The heart and mediastinal structures are normal. There is no pleural effusion or pneumothorax. The bony thorax is unremarkable. IMPRESSION: There is stable emphysematous changes bilaterally. There is no acute cardiopulmonary abnormality. Electronically Signed   By: David  [place]   On: 10/27/2013 17:27    Assessment & Plan:   There are no diagnoses linked to this encounter. I have discontinued [name] [name]'s fluticasone and venlafaxine XR. I am also having him maintain his meloxicam, cholecalciferol, simvastatin, omeprazole, ANORO ELLIPTA, levothyroxine, Armodafinil, and Albuterol Sulfate.  No orders of the defined types were placed in this encounter.    Follow-up: No Follow-up on file.  Sonda PrimesAlex Plotnikov, MD

## 2016-04-20 NOTE — Assessment & Plan Note (Signed)
Anoro, Proair

## 2016-04-20 NOTE — Addendum Note (Signed)
Addended by: Conception ChancyOBERSON, Circe Chilton R on: 04/20/2016 09:59 AM   Modules accepted: Orders

## 2016-06-13 ENCOUNTER — Other Ambulatory Visit: Payer: Self-pay | Admitting: *Deleted

## 2016-06-13 MED ORDER — ARMODAFINIL 150 MG PO TABS: 150.0000 mg | ORAL_TABLET | ORAL | 2 refills | Status: DC

## 2016-06-13 NOTE — Telephone Encounter (Signed)
Script was [facility]ed to [facility]...Raechel Chute/lmb

## 2016-07-10 DIAGNOSIS — L570 Actinic keratosis: Secondary | ICD-10-CM | POA: Diagnosis not present

## 2016-07-24 ENCOUNTER — Other Ambulatory Visit: Payer: Self-pay | Admitting: Internal Medicine

## 2016-07-25 ENCOUNTER — Encounter: Payer: Self-pay | Admitting: Internal Medicine

## 2016-07-27 ENCOUNTER — Other Ambulatory Visit (INDEPENDENT_AMBULATORY_CARE_PROVIDER_SITE_OTHER): Payer: Federal, State, Local not specified - PPO

## 2016-07-27 ENCOUNTER — Other Ambulatory Visit: Payer: Self-pay | Admitting: Internal Medicine

## 2016-07-27 DIAGNOSIS — R7309 Other abnormal glucose: Secondary | ICD-10-CM

## 2016-07-27 DIAGNOSIS — Z0000 Encounter for general adult medical examination without abnormal findings: Secondary | ICD-10-CM | POA: Diagnosis not present

## 2016-07-27 LAB — BASIC METABOLIC PANEL
BUN: 11 mg/dL (ref 6–23)
CHLORIDE: 104 meq/L (ref 96–112)
CO2: 30 mEq/L (ref 19–32)
CREATININE: 0.9 mg/dL (ref 0.40–1.50)
Calcium: 9.9 mg/dL (ref 8.4–10.5)
GFR: 93.4 mL/min (ref >60.00)
GLUCOSE: 92 mg/dL (ref 70–99)
POTASSIUM: 4.4 meq/L (ref 3.5–5.1)
Sodium: 140 mEq/L (ref 135–145)

## 2016-07-27 LAB — HEPATIC FUNCTION PANEL
ALBUMIN: 4.6 g/dL (ref 3.5–5.2)
ALK PHOS: 71 U/L (ref 39–117)
ALT: 19 U/L (ref 0–53)
AST: 16 U/L (ref 0–37)
BILIRUBIN DIRECT: 0.1 mg/dL (ref 0.0–0.3)
TOTAL PROTEIN: 7 g/dL (ref 6.0–8.3)
Total Bilirubin: 0.4 mg/dL (ref 0.2–1.2)

## 2016-07-27 LAB — LIPID PANEL
CHOLESTEROL: 197 mg/dL (ref 0–200)
HDL: 45.6 mg/dL (ref >39.00)
LDL CALC: 123 mg/dL — AB (ref 0–99)
NONHDL: 151.32
Total CHOL/HDL Ratio: 4
Triglycerides: 140 mg/dL (ref 0.0–149.0)
VLDL: 28 mg/dL (ref 0.0–40.0)

## 2016-07-27 LAB — CBC WITH DIFFERENTIAL/PLATELET
BASOS ABS: 0.1 10*3/uL (ref 0.0–0.1)
BASOS PCT: 0.9 % (ref 0.0–3.0)
Eosinophils Absolute: 0.1 10*3/uL (ref 0.0–0.7)
Eosinophils Relative: 1.8 % (ref 0.0–5.0)
HEMATOCRIT: 43.1 % (ref 39.0–52.0)
HEMOGLOBIN: 14.5 g/dL (ref 13.0–17.0)
LYMPHS PCT: 22.4 % (ref 12.0–46.0)
Lymphs Abs: 1.8 10*3/uL (ref 0.7–4.0)
MCHC: 33.7 g/dL (ref 30.0–36.0)
MCV: 91 fl (ref 78.0–100.0)
Monocytes Absolute: 0.6 10*3/uL (ref 0.1–1.0)
Monocytes Relative: 7.5 % (ref 3.0–12.0)
NEUTROS ABS: 5.5 10*3/uL (ref 1.4–7.7)
Neutrophils Relative %: 67.4 % (ref 43.0–77.0)
PLATELETS: 264 10*3/uL (ref 150.0–400.0)
RBC: 4.73 Mil/uL (ref 4.22–5.81)
RDW: 13.6 % (ref 11.5–15.5)
WBC: 8.1 10*3/uL (ref 4.0–10.5)

## 2016-07-27 LAB — URINALYSIS
BILIRUBIN URINE: NEGATIVE
KETONES UR: NEGATIVE
LEUKOCYTES UA: NEGATIVE
Nitrite: NEGATIVE
PH: 7 (ref 5.0–8.0)
Specific Gravity, Urine: <=1.005 — AB (ref 1.000–1.030)
TOTAL PROTEIN, URINE-UPE24: NEGATIVE
Urine Glucose: NEGATIVE
Urobilinogen, UA: 0.2 (ref 0.0–1.0)

## 2016-07-27 LAB — HEMOGLOBIN A1C: Hgb A1c MFr Bld: 6.4 % (ref 4.6–6.5)

## 2016-07-27 LAB — PSA: PSA: 0.46 ng/mL (ref 0.10–4.00)

## 2016-07-27 LAB — TSH: TSH: 3.64 u[IU]/mL (ref 0.35–4.50)

## 2016-07-28 ENCOUNTER — Encounter: Payer: Self-pay | Admitting: Internal Medicine

## 2016-07-31 ENCOUNTER — Encounter: Payer: Self-pay | Admitting: Internal Medicine

## 2016-07-31 ENCOUNTER — Ambulatory Visit (INDEPENDENT_AMBULATORY_CARE_PROVIDER_SITE_OTHER): Payer: Federal, State, Local not specified - PPO | Admitting: Internal Medicine

## 2016-07-31 VITALS — BP 136/88 | HR 89 | Temp 97.6°F | Ht 74.0 in | Wt 207.0 lb

## 2016-07-31 DIAGNOSIS — Z7289 Other problems related to lifestyle: Secondary | ICD-10-CM | POA: Diagnosis not present

## 2016-07-31 DIAGNOSIS — Z720 Tobacco use: Secondary | ICD-10-CM

## 2016-07-31 DIAGNOSIS — I700 Atherosclerosis of aorta: Secondary | ICD-10-CM | POA: Diagnosis not present

## 2016-07-31 DIAGNOSIS — R5382 Chronic fatigue, unspecified: Secondary | ICD-10-CM | POA: Diagnosis not present

## 2016-07-31 DIAGNOSIS — Z0000 Encounter for general adult medical examination without abnormal findings: Secondary | ICD-10-CM | POA: Diagnosis not present

## 2016-07-31 DIAGNOSIS — E785 Hyperlipidemia, unspecified: Secondary | ICD-10-CM

## 2016-07-31 MED ORDER — UMECLIDINIUM-VILANTEROL 62.5-25 MCG/INH IN AEPB: INHALATION_SPRAY | RESPIRATORY_TRACT | 11 refills | Status: DC

## 2016-07-31 NOTE — Assessment & Plan Note (Signed)
Discussed.

## 2016-07-31 NOTE — Patient Instructions (Signed)
Cologuard Shingrix

## 2016-07-31 NOTE — Assessment & Plan Note (Signed)
Simvastatin 

## 2016-07-31 NOTE — Assessment & Plan Note (Signed)
<  1/2 ppd - discussed ?

## 2016-07-31 NOTE — Progress Notes (Signed)
Subjective:  Patient ID: [name] [name], male    DOB: 02/20/1961  Age: 56 y.o. MRN: [id number]  CC: Annual Exam   HPI [name] [name] presents for a well exam F/u HTN, COPD, hypothyroidism  Outpatient Medications Prior to Visit  Medication Sig Dispense Refill  . Albuterol Sulfate (PROAIR RESPICLICK) 108 (90 Base) MCG/ACT AEPB Inhale 1-2 puffs into the lungs 4 (four) times daily as needed. 1 each 5  . ANORO ELLIPTA 62.5-25 MCG/INH AEPB INHALE ONE ACTUATION INTO THE LUNGS DAILY 60 each 3  . Armodafinil 150 MG tablet Take 1 tablet (150 mg total) by mouth every morning. 90 tablet 2  . cholecalciferol (VITAMIN D) 1000 UNITS tablet Take 1,000 Units by mouth daily.      Marland Kitchen levothyroxine (SYNTHROID, LEVOTHROID) 137 MCG tablet TAKE ONE TABLET BY MOUTH ONE TIME DAILY 90 tablet 2  . meloxicam (MOBIC) 15 MG tablet Take 15 mg by mouth daily.      Marland Kitchen omeprazole (PRILOSEC) 40 MG capsule Take 1 capsule (40 mg total) by mouth daily. Yearly physical w/labs is due must see MD for refills 30 capsule 0  . simvastatin (ZOCOR) 40 MG tablet TAKE ONE TABLET BY MOUTH ONE TIME DAILY 90 tablet 3  . cefdinir (OMNICEF) 300 MG capsule Take 1 capsule (300 mg total) by mouth 2 (two) times daily. 20 capsule 0  . omeprazole (PRILOSEC) 40 MG capsule Take 1 capsule (40 mg total) by mouth daily. 90 capsule 2   No facility-administered medications prior to visit.     ROS Review of Systems  Constitutional: Negative for appetite change, fatigue and unexpected weight change.  HENT: Negative for congestion, nosebleeds, sneezing, sore throat and trouble swallowing.   Eyes: Negative for itching and visual disturbance.  Respiratory: Negative for cough.   Cardiovascular: Negative for chest pain, palpitations and leg swelling.  Gastrointestinal: Negative for abdominal distention, blood in stool, diarrhea and nausea.  Genitourinary: Negative for frequency and hematuria.  Musculoskeletal: Positive for arthralgias. Negative for  back pain, gait problem, joint swelling and neck pain.  Skin: Negative for rash.  Neurological: Negative for dizziness, tremors, speech difficulty and weakness.  Psychiatric/Behavioral: Negative for agitation, dysphoric mood and sleep disturbance. The patient is not nervous/anxious.     Objective:  BP 136/88   Pulse 89   Temp 97.6 F (36.4 C)   Ht  (1.88 m)   Wt 207 lb (93.9 kg)   SpO2 99%   BMI 26.58 kg/m   BP Readings from Last 3 Encounters:  07/31/16 136/88  04/20/16 (!) 148/90  01/20/16 130/80    Wt Readings from Last 3 Encounters:  07/31/16 207 lb (93.9 kg)  04/20/16 214 lb (97.1 kg)  01/20/16 213 lb (96.6 kg)    Physical Exam  Constitutional: He is oriented to person, place, and time. He appears well-developed and well-nourished. No distress.  HENT:  Head: Normocephalic and atraumatic.  Right Ear: External ear normal.  Left Ear: External ear normal.  Nose: Nose normal.  Mouth/Throat: Oropharynx is clear and moist. No oropharyngeal exudate.  Eyes: Conjunctivae and EOM are normal. Pupils are equal, round, and reactive to light. Right eye exhibits no discharge. Left eye exhibits no discharge. No scleral icterus.  Neck: Normal range of motion. Neck supple. No JVD present. No tracheal deviation present. No thyromegaly present.  Cardiovascular: Normal rate, regular rhythm, normal heart sounds and intact distal pulses.  Exam reveals no gallop and no friction rub.   No murmur heard. Pulmonary/Chest:  Effort normal and breath sounds normal. No stridor. No respiratory distress. He has no wheezes. He has no rales. He exhibits no tenderness.  Abdominal: Soft. Bowel sounds are normal. He exhibits no distension and no mass. There is no tenderness. There is no rebound and no guarding.  Genitourinary: Rectum normal, prostate normal and penis normal. Rectal exam shows guaiac negative stool. No penile tenderness.  Musculoskeletal: Normal range of motion. He exhibits no edema or  tenderness.  Lymphadenopathy:    He has no cervical adenopathy.  Neurological: He is alert and oriented to person, place, and time. He has normal reflexes. No cranial nerve deficit. He exhibits normal muscle tone. Coordination normal.  Skin: Skin is warm and dry. No rash noted. He is not diaphoretic. No erythema. No pallor.  Psychiatric: He has a normal mood and affect. His behavior is normal. Judgment and thought content normal.    Lab Results  Component Value Date   WBC 8.1 07/27/2016   HGB 14.5 07/27/2016   HCT 43.1 07/27/2016   PLT 264.0 07/27/2016   GLUCOSE 92 07/27/2016   CHOL 197 07/27/2016   TRIG 140.0 07/27/2016   HDL 45.60 07/27/2016   LDLDIRECT 135.6 01/25/2012   LDLCALC 123 (H) 07/27/2016   ALT 19 07/27/2016   AST 16 07/27/2016   NA 140 07/27/2016   K 4.4 07/27/2016   CL 104 07/27/2016   CREATININE 0.90 07/27/2016   BUN 11 07/27/2016   CO2 30 07/27/2016   TSH 3.64 07/27/2016   PSA 0.46 07/27/2016   INR 0.9 RATIO 01/28/2007   HGBA1C 6.4 07/27/2016    Dg Chest 2 View  Result Date: 04/20/2016 CLINICAL DATA:  Cough, chest congestion, and dyspnea for the past 6 weeks. History of pneumonia and COPD. Current smoker. EXAM: CHEST  2 VIEW COMPARISON:  Chest x-ray of October 27, 2013 FINDINGS: The lungs are mildly hyperinflated. There is stable scarring in the left mid and lower lung. There is no alveolar infiltrate or pleural effusion. The heart and pulmonary vascularity are normal. There is calcification in the wall of the aortic arch. There is no pleural effusion. The bony thorax exhibits no acute abnormality. IMPRESSION: Chronic emphysematous changes with scarring on the left. No alveolar pneumonia, CHF, nor other acute cardiopulmonary abnormality. Thoracic aortic atherosclerosis. Electronically Signed   By: David  [place] M.D.   On: 04/20/2016 10:13    Assessment & Plan:   There are no diagnoses linked to this encounter. I have discontinued Mr. Graffam cefdinir. I am  also having him maintain his meloxicam, cholecalciferol, simvastatin, ANORO ELLIPTA, levothyroxine, Albuterol Sulfate, Armodafinil, and omeprazole.  No orders of the defined types were placed in this encounter.    Follow-up: No Follow-up on file.  Sonda Primes, MD

## 2016-07-31 NOTE — Assessment & Plan Note (Addendum)
We discussed age appropriate health related issues, including available/recomended screening tests and vaccinations. We discussed a need for adhering to healthy diet and exercise. Labs/EKG were reviewed/ordered. All questions were answered.  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  (Max 5/year)  Shingrix suggested

## 2016-08-02 ENCOUNTER — Telehealth: Payer: Self-pay

## 2016-08-02 NOTE — Telephone Encounter (Signed)
Left message advising patient that shingrix is available in our office, patient's insurance [employer] should cover both injections, but patient is asked to contact insurance company first to make sure it is covered, then call our office to schedule nurse visit at his convenience to get first injection, and then 2nd injection 2-3 months afterwards---ok to give injection per dr plotnikov--can talk with Kaidyn Javid if any questions

## 2016-08-27 ENCOUNTER — Other Ambulatory Visit: Payer: Self-pay | Admitting: Internal Medicine

## 2016-08-28 ENCOUNTER — Other Ambulatory Visit: Payer: Self-pay | Admitting: Internal Medicine

## 2016-09-01 ENCOUNTER — Other Ambulatory Visit: Payer: Self-pay | Admitting: Internal Medicine

## 2016-09-25 ENCOUNTER — Other Ambulatory Visit: Payer: Self-pay | Admitting: Internal Medicine

## 2016-10-17 DIAGNOSIS — S83241D Other tear of medial meniscus, current injury, right knee, subsequent encounter: Secondary | ICD-10-CM | POA: Diagnosis not present

## 2016-10-21 DIAGNOSIS — M25561 Pain in right knee: Secondary | ICD-10-CM | POA: Diagnosis not present

## 2016-12-26 ENCOUNTER — Encounter: Payer: Self-pay | Admitting: Internal Medicine

## 2016-12-26 MED ORDER — ARMODAFINIL 150 MG PO TABS: 150.0000 mg | ORAL_TABLET | ORAL | 1 refills | Status: DC

## 2017-01-30 ENCOUNTER — Other Ambulatory Visit (INDEPENDENT_AMBULATORY_CARE_PROVIDER_SITE_OTHER): Payer: Federal, State, Local not specified - PPO

## 2017-01-30 ENCOUNTER — Encounter: Payer: Self-pay | Admitting: Internal Medicine

## 2017-01-30 ENCOUNTER — Ambulatory Visit (INDEPENDENT_AMBULATORY_CARE_PROVIDER_SITE_OTHER): Payer: Federal, State, Local not specified - PPO | Admitting: Internal Medicine

## 2017-01-30 VITALS — BP 132/80 | HR 76 | Temp 97.8°F | Ht 74.0 in | Wt 210.0 lb

## 2017-01-30 DIAGNOSIS — Z0000 Encounter for general adult medical examination without abnormal findings: Secondary | ICD-10-CM | POA: Diagnosis not present

## 2017-01-30 DIAGNOSIS — E785 Hyperlipidemia, unspecified: Secondary | ICD-10-CM

## 2017-01-30 DIAGNOSIS — G8929 Other chronic pain: Secondary | ICD-10-CM

## 2017-01-30 DIAGNOSIS — E034 Atrophy of thyroid (acquired): Secondary | ICD-10-CM | POA: Diagnosis not present

## 2017-01-30 DIAGNOSIS — Z23 Encounter for immunization: Secondary | ICD-10-CM

## 2017-01-30 DIAGNOSIS — J309 Allergic rhinitis, unspecified: Secondary | ICD-10-CM

## 2017-01-30 DIAGNOSIS — J310 Chronic rhinitis: Secondary | ICD-10-CM | POA: Insufficient documentation

## 2017-01-30 DIAGNOSIS — Z7289 Other problems related to lifestyle: Secondary | ICD-10-CM | POA: Diagnosis not present

## 2017-01-30 DIAGNOSIS — M545 Low back pain: Secondary | ICD-10-CM | POA: Diagnosis not present

## 2017-01-30 LAB — BASIC METABOLIC PANEL
BUN: 10 mg/dL (ref 6–23)
CALCIUM: 9.4 mg/dL (ref 8.4–10.5)
CO2: 29 meq/L (ref 19–32)
Chloride: 102 mEq/L (ref 96–112)
Creatinine, Ser: 0.9 mg/dL (ref 0.40–1.50)
GFR: 93.23 mL/min (ref >60.00)
GLUCOSE: 110 mg/dL — AB (ref 70–99)
POTASSIUM: 3.9 meq/L (ref 3.5–5.1)
SODIUM: 138 meq/L (ref 135–145)

## 2017-01-30 LAB — HEPATIC FUNCTION PANEL
ALBUMIN: 4.3 g/dL (ref 3.5–5.2)
ALT: 22 U/L (ref 0–53)
AST: 14 U/L (ref 0–37)
Alkaline Phosphatase: 64 U/L (ref 39–117)
BILIRUBIN TOTAL: 0.3 mg/dL (ref 0.2–1.2)
Bilirubin, Direct: 0.1 mg/dL (ref 0.0–0.3)
Total Protein: 6.7 g/dL (ref 6.0–8.3)

## 2017-01-30 MED ORDER — MOMETASONE FUROATE 50 MCG/ACT NA SUSP: 2.0000 | Freq: Every day | NASAL | 11 refills | Status: DC

## 2017-01-30 MED ORDER — MONTELUKAST SODIUM 10 MG PO TABS: 10.0000 mg | ORAL_TABLET | Freq: Every day | ORAL | 3 refills | Status: DC

## 2017-01-30 MED ORDER — LORATADINE 10 MG PO TABS: 10.0000 mg | ORAL_TABLET | Freq: Every day | ORAL | 3 refills | Status: DC

## 2017-01-30 MED ORDER — METHYLPREDNISOLONE ACETATE 80 MG/ML IJ SUSP
80.0000 mg | Freq: Once | INTRAMUSCULAR | Status: AC
  Administered 2017-01-30: 80 mg via INTRAMUSCULAR

## 2017-01-30 NOTE — Assessment & Plan Note (Signed)
simvastatin 

## 2017-01-30 NOTE — Addendum Note (Signed)
Addended by: Scarlett Presto on: 01/30/2017 12:07 PM   Modules accepted: Orders

## 2017-01-30 NOTE — Assessment & Plan Note (Signed)
Worse Depo-medrol IM Singulair, Claritin, Nasonex

## 2017-01-30 NOTE — Progress Notes (Signed)
Subjective:  Patient ID: [name] [name], male    DOB: 11-20-1960  Age: 56 y.o. MRN: [id number]  CC: No chief complaint on file.   HPI ROMEO ZIELINSKI presents for asthma, bad allergies x 4 mo, hypothyroidism f/u  Outpatient Medications Prior to Visit  Medication Sig Dispense Refill  . Albuterol Sulfate (PROAIR RESPICLICK) 108 (90 Base) MCG/ACT AEPB Inhale 1-2 puffs into the lungs 4 (four) times daily as needed. 1 each 5  . Armodafinil 150 MG tablet Take 1 tablet (150 mg total) by mouth every morning. 90 tablet 1  . cholecalciferol (VITAMIN D) 1000 UNITS tablet Take 1,000 Units by mouth daily.      Marland Kitchen levothyroxine (SYNTHROID, LEVOTHROID) 137 MCG tablet TAKE ONE TABLET BY MOUTH ONE TIME DAILY 90 tablet 1  . meloxicam (MOBIC) 15 MG tablet Take 15 mg by mouth daily.      Marland Kitchen omeprazole (PRILOSEC) 40 MG capsule Take 1 capsule (40 mg total) by mouth daily. 90 capsule 3  . simvastatin (ZOCOR) 40 MG tablet TAKE ONE TABLET BY MOUTH ONE TIME DAILY 90 tablet 3  . umeclidinium-vilanterol (ANORO ELLIPTA) 62.5-25 MCG/INH AEPB INHALE ONE ACTUATION INTO THE LUNGS DAILY 60 each 11  . omeprazole (PRILOSEC) 40 MG capsule Take 1 capsule (40 mg total) by mouth daily. 90 capsule 3   No facility-administered medications prior to visit.     ROS Review of Systems  Constitutional: Negative for appetite change, fatigue and unexpected weight change.  HENT: Positive for congestion, sinus pain, sinus pressure and sneezing. Negative for nosebleeds, sore throat and trouble swallowing.   Eyes: Negative for itching and visual disturbance.  Respiratory: Positive for cough and wheezing.   Cardiovascular: Negative for chest pain, palpitations and leg swelling.  Gastrointestinal: Negative for abdominal distention, blood in stool, diarrhea and nausea.  Genitourinary: Negative for frequency and hematuria.  Musculoskeletal: Negative for back pain, gait problem, joint swelling and neck pain.  Skin: Negative for rash.    Neurological: Negative for dizziness, tremors, speech difficulty and weakness.  Psychiatric/Behavioral: Negative for agitation, dysphoric mood and sleep disturbance. The patient is not nervous/anxious.     Objective:  BP 132/80 (BP Location: Left Arm, Patient Position: Sitting, Cuff Size: Large)   Pulse 76   Temp 97.8 F (36.6 C) (Oral)   Ht  (1.88 m)   Wt 210 lb (95.3 kg)   SpO2 100%   BMI 26.96 kg/m   BP Readings from Last 3 Encounters:  01/30/17 132/80  07/31/16 136/88  04/20/16 (!) 148/90    Wt Readings from Last 3 Encounters:  01/30/17 210 lb (95.3 kg)  07/31/16 207 lb (93.9 kg)  04/20/16 214 lb (97.1 kg)    Physical Exam  Constitutional: He is oriented to person, place, and time. He appears well-developed. No distress.  NAD  HENT:  Mouth/Throat: Oropharynx is clear and moist.  Eyes: Pupils are equal, round, and reactive to light. Conjunctivae are normal.  Neck: Normal range of motion. No JVD present. No thyromegaly present.  Cardiovascular: Normal rate, regular rhythm, normal heart sounds and intact distal pulses.  Exam reveals no gallop and no friction rub.   No murmur heard. Pulmonary/Chest: Effort normal and breath sounds normal. No respiratory distress. He has no wheezes. He has no rales. He exhibits no tenderness.  Abdominal: Soft. Bowel sounds are normal. He exhibits no distension and no mass. There is no tenderness. There is no rebound and no guarding.  Musculoskeletal: Normal range of motion. He exhibits  no edema or tenderness.  Lymphadenopathy:    He has no cervical adenopathy.  Neurological: He is alert and oriented to person, place, and time. He has normal reflexes. No cranial nerve deficit. He exhibits normal muscle tone. He displays a negative Romberg sign. Coordination and gait normal.  Skin: Skin is warm and dry. No rash noted.  Psychiatric: He has a normal mood and affect. His behavior is normal. Judgment and thought content normal.    Lab  Results  Component Value Date   WBC 8.1 07/27/2016   HGB 14.5 07/27/2016   HCT 43.1 07/27/2016   PLT 264.0 07/27/2016   GLUCOSE 92 07/27/2016   CHOL 197 07/27/2016   TRIG 140.0 07/27/2016   HDL 45.60 07/27/2016   LDLDIRECT 135.6 01/25/2012   LDLCALC 123 (H) 07/27/2016   ALT 19 07/27/2016   AST 16 07/27/2016   NA 140 07/27/2016   K 4.4 07/27/2016   CL 104 07/27/2016   CREATININE 0.90 07/27/2016   BUN 11 07/27/2016   CO2 30 07/27/2016   TSH 3.64 07/27/2016   PSA 0.46 07/27/2016   INR 0.9 RATIO 01/28/2007   HGBA1C 6.4 07/27/2016    Dg Chest 2 View  Result Date: 04/20/2016 CLINICAL DATA:  Cough, chest congestion, and dyspnea for the past 6 weeks. History of pneumonia and COPD. Current smoker. EXAM: CHEST  2 VIEW COMPARISON:  Chest x-ray of October 27, 2013 FINDINGS: The lungs are mildly hyperinflated. There is stable scarring in the left mid and lower lung. There is no alveolar infiltrate or pleural effusion. The heart and pulmonary vascularity are normal. There is calcification in the wall of the aortic arch. There is no pleural effusion. The bony thorax exhibits no acute abnormality. IMPRESSION: Chronic emphysematous changes with scarring on the left. No alveolar pneumonia, CHF, nor other acute cardiopulmonary abnormality. Thoracic aortic atherosclerosis. Electronically Signed   By: David  [place] M.D.   On: 04/20/2016 10:13    Assessment & Plan:   There are no diagnoses linked to this encounter. I am having Mr. Britten maintain his meloxicam, cholecalciferol, Albuterol Sulfate, umeclidinium-vilanterol, simvastatin, omeprazole, levothyroxine, and Armodafinil.  No orders of the defined types were placed in this encounter.    Follow-up: No Follow-up on file.  Sonda Primes, MD

## 2017-01-30 NOTE — Assessment & Plan Note (Signed)
Doing fair 

## 2017-01-30 NOTE — Assessment & Plan Note (Signed)
On Levothroid 

## 2017-01-31 LAB — HEPATITIS C ANTIBODY
Hepatitis C Ab: NONREACTIVE
SIGNAL TO CUT-OFF: 0.03 (ref <1.00)

## 2017-02-22 ENCOUNTER — Encounter: Payer: Self-pay | Admitting: Internal Medicine

## 2017-02-23 ENCOUNTER — Telehealth: Payer: Self-pay

## 2017-02-23 NOTE — Telephone Encounter (Signed)
Order [id number]

## 2017-03-08 ENCOUNTER — Other Ambulatory Visit: Payer: Self-pay | Admitting: Internal Medicine

## 2017-03-12 DIAGNOSIS — F17210 Nicotine dependence, cigarettes, uncomplicated: Secondary | ICD-10-CM | POA: Diagnosis not present

## 2017-03-12 DIAGNOSIS — G4733 Obstructive sleep apnea (adult) (pediatric): Secondary | ICD-10-CM | POA: Diagnosis not present

## 2017-03-12 DIAGNOSIS — Z9989 Dependence on other enabling machines and devices: Secondary | ICD-10-CM | POA: Diagnosis not present

## 2017-03-12 DIAGNOSIS — J439 Emphysema, unspecified: Secondary | ICD-10-CM | POA: Diagnosis not present

## 2017-04-05 ENCOUNTER — Telehealth: Payer: Self-pay | Admitting: *Deleted

## 2017-04-05 NOTE — Telephone Encounter (Signed)
PA sent in for Nuvigil 150mg  tablet  (Key: [id number])  Nuvigil 150MG  OR TABS Status: PA Request

## 2017-04-06 NOTE — Telephone Encounter (Signed)
Approved from 03/06/17-04/05/18

## 2017-04-12 LAB — COLOGUARD: Cologuard: NEGATIVE

## 2017-04-28 DIAGNOSIS — G4733 Obstructive sleep apnea (adult) (pediatric): Secondary | ICD-10-CM | POA: Diagnosis not present

## 2017-05-26 NOTE — Telephone Encounter (Signed)
Test negative

## 2017-07-09 ENCOUNTER — Other Ambulatory Visit: Payer: Self-pay | Admitting: Internal Medicine

## 2017-07-10 NOTE — Telephone Encounter (Signed)
Please advise about refill in Dr. Plotnikovs absence. 

## 2017-07-10 NOTE — Telephone Encounter (Signed)
Done erx 

## 2017-08-01 ENCOUNTER — Encounter: Payer: Self-pay | Admitting: Internal Medicine

## 2017-08-01 ENCOUNTER — Ambulatory Visit: Payer: Federal, State, Local not specified - PPO | Admitting: Internal Medicine

## 2017-08-01 DIAGNOSIS — G4733 Obstructive sleep apnea (adult) (pediatric): Secondary | ICD-10-CM

## 2017-08-01 DIAGNOSIS — E785 Hyperlipidemia, unspecified: Secondary | ICD-10-CM

## 2017-08-01 DIAGNOSIS — E034 Atrophy of thyroid (acquired): Secondary | ICD-10-CM

## 2017-08-01 DIAGNOSIS — Z9989 Dependence on other enabling machines and devices: Secondary | ICD-10-CM | POA: Diagnosis not present

## 2017-08-01 DIAGNOSIS — R7309 Other abnormal glucose: Secondary | ICD-10-CM

## 2017-08-01 NOTE — Assessment & Plan Note (Signed)
On CPAP. ?

## 2017-08-01 NOTE — Assessment & Plan Note (Signed)
Labs

## 2017-08-01 NOTE — Progress Notes (Signed)
Subjective:  Patient ID: [name] [name], male    DOB: [birth year]  Age: 57 y.o. MRN: [id number]  CC: No chief complaint on file.   HPI [name] [name] presents for allergies - worse F/u asthma, hypothyroidism f/u  Outpatient Medications Prior to Visit  Medication Sig Dispense Refill  . Albuterol Sulfate (PROAIR RESPICLICK) 108 (90 Base) MCG/ACT AEPB Inhale 1-2 puffs into the lungs 4 (four) times daily as needed. 1 each 5  . Armodafinil 150 MG tablet TAKE 1 TABLET EVERY MORNING 90 tablet 0  . cholecalciferol (VITAMIN D) 1000 UNITS tablet Take 1,000 Units by mouth daily.      Marland Kitchen levothyroxine (SYNTHROID, LEVOTHROID) 137 MCG tablet TAKE ONE TABLET BY MOUTH ONE TIME DAILY 90 tablet 3  . montelukast (SINGULAIR) 10 MG tablet Take 1 tablet (10 mg total) by mouth daily. 100 tablet 3  . omeprazole (PRILOSEC) 40 MG capsule Take 1 capsule (40 mg total) by mouth daily. 90 capsule 3  . simvastatin (ZOCOR) 40 MG tablet TAKE ONE TABLET BY MOUTH ONE TIME DAILY 90 tablet 3  . umeclidinium-vilanterol (ANORO ELLIPTA) 62.5-25 MCG/INH AEPB INHALE ONE ACTUATION INTO THE LUNGS DAILY 60 each 11  . loratadine (CLARITIN) 10 MG tablet Take 1 tablet (10 mg total) by mouth daily. 90 tablet 3  . meloxicam (MOBIC) 15 MG tablet Take 15 mg by mouth daily.      . mometasone (NASONEX) 50 MCG/ACT nasal spray Place 2 sprays into the nose daily. 17 g 11   No facility-administered medications prior to visit.     ROS Review of Systems  Constitutional: Negative for appetite change, fatigue and unexpected weight change.  HENT: Positive for congestion, rhinorrhea and sinus pressure. Negative for nosebleeds, sneezing, sore throat, trouble swallowing and voice change.   Eyes: Negative for itching and visual disturbance.  Respiratory: Negative for cough and wheezing.   Cardiovascular: Negative for chest pain, palpitations and leg swelling.  Gastrointestinal: Negative for abdominal distention, blood in stool, diarrhea and  nausea.  Genitourinary: Negative for frequency and hematuria.  Musculoskeletal: Negative for back pain, gait problem, joint swelling and neck pain.  Skin: Negative for rash.  Neurological: Negative for dizziness, tremors, speech difficulty and weakness.  Psychiatric/Behavioral: Negative for agitation, dysphoric mood and sleep disturbance. The patient is not nervous/anxious.     Objective:  BP 128/82 (BP Location: Left Arm, Patient Position: Sitting, Cuff Size: Large)   Pulse 81   Temp 98.3 F (36.8 C) (Oral)   Ht 6\' 2"  (1.88 m)   Wt 207 lb (93.9 kg)   SpO2 98%   BMI 26.58 kg/m   BP Readings from Last 3 Encounters:  08/01/17 128/82  01/30/17 132/80  07/31/16 136/88    Wt Readings from Last 3 Encounters:  08/01/17 207 lb (93.9 kg)  01/30/17 210 lb (95.3 kg)  07/31/16 207 lb (93.9 kg)    Physical Exam  Constitutional: He is oriented to person, place, and time. He appears well-developed. No distress.  NAD  HENT:  Mouth/Throat: Oropharynx is clear and moist.  Eyes: Pupils are equal, round, and reactive to light. Conjunctivae are normal.  Neck: Normal range of motion. No JVD present. No thyromegaly present.  Cardiovascular: Normal rate, regular rhythm, normal heart sounds and intact distal pulses. Exam reveals no gallop and no friction rub.  No murmur heard. Pulmonary/Chest: Effort normal and breath sounds normal. No respiratory distress. He has no wheezes. He has no rales. He exhibits no tenderness.  Abdominal: Soft. Bowel sounds  are normal. He exhibits no distension and no mass. There is no tenderness. There is no rebound and no guarding.  Musculoskeletal: Normal range of motion. He exhibits no edema or tenderness.  Lymphadenopathy:    He has no cervical adenopathy.  Neurological: He is alert and oriented to person, place, and time. He has normal reflexes. No cranial nerve deficit. He exhibits normal muscle tone. He displays a negative Romberg sign. Coordination and gait  normal.  Skin: Skin is warm and dry. No rash noted.  Psychiatric: He has a normal mood and affect. His behavior is normal. Judgment and thought content normal.  Obese  Lab Results  Component Value Date   WBC 8.1 07/27/2016   HGB 14.5 07/27/2016   HCT 43.1 07/27/2016   PLT 264.0 07/27/2016   GLUCOSE 110 (H) 01/30/2017   CHOL 197 07/27/2016   TRIG 140.0 07/27/2016   HDL 45.60 07/27/2016   LDLDIRECT 135.6 01/25/2012   LDLCALC 123 (H) 07/27/2016   ALT 22 01/30/2017   AST 14 01/30/2017   NA 138 01/30/2017   K 3.9 01/30/2017   CL 102 01/30/2017   CREATININE 0.90 01/30/2017   BUN 10 01/30/2017   CO2 29 01/30/2017   TSH 3.64 07/27/2016   PSA 0.46 07/27/2016   INR 0.9 RATIO 01/28/2007   HGBA1C 6.4 07/27/2016    Dg Chest 2 View  Result Date: 04/20/2016 CLINICAL DATA:  Cough, chest congestion, and dyspnea for the past 6 weeks. History of pneumonia and COPD. Current smoker. EXAM: CHEST  2 VIEW COMPARISON:  Chest x-ray of October 27, 2013 FINDINGS: The lungs are mildly hyperinflated. There is stable scarring in the left mid and lower lung. There is no alveolar infiltrate or pleural effusion. The heart and pulmonary vascularity are normal. There is calcification in the wall of the aortic arch. There is no pleural effusion. The bony thorax exhibits no acute abnormality. IMPRESSION: Chronic emphysematous changes with scarring on the left. No alveolar pneumonia, CHF, nor other acute cardiopulmonary abnormality. Thoracic aortic atherosclerosis. Electronically Signed   By: David  [place] M.D.   On: 04/20/2016 10:13    Assessment & Plan:   There are no diagnoses linked to this encounter. I have discontinued Venetia NightMichael G. Masullo's meloxicam, loratadine, and mometasone. I am also having him maintain his cholecalciferol, Albuterol Sulfate, umeclidinium-vilanterol, omeprazole, montelukast, levothyroxine, simvastatin, and Armodafinil.  No orders of the defined types were placed in this  encounter.    Follow-up: No follow-ups on file.  Sonda PrimesAlex Plotnikov, MD

## 2017-08-01 NOTE — Assessment & Plan Note (Signed)
Simvastatin 

## 2017-08-01 NOTE — Assessment & Plan Note (Signed)
Levothroid 

## 2017-08-14 ENCOUNTER — Other Ambulatory Visit: Payer: Self-pay | Admitting: Internal Medicine

## 2017-10-22 ENCOUNTER — Other Ambulatory Visit: Payer: Self-pay | Admitting: Internal Medicine

## 2017-11-18 ENCOUNTER — Other Ambulatory Visit: Payer: Self-pay | Admitting: Internal Medicine

## 2017-12-14 ENCOUNTER — Other Ambulatory Visit: Payer: Self-pay | Admitting: Internal Medicine

## 2018-01-11 ENCOUNTER — Other Ambulatory Visit: Payer: Self-pay | Admitting: Internal Medicine

## 2018-01-29 ENCOUNTER — Other Ambulatory Visit (INDEPENDENT_AMBULATORY_CARE_PROVIDER_SITE_OTHER): Payer: Federal, State, Local not specified - PPO

## 2018-01-29 DIAGNOSIS — E034 Atrophy of thyroid (acquired): Secondary | ICD-10-CM | POA: Diagnosis not present

## 2018-01-29 DIAGNOSIS — E785 Hyperlipidemia, unspecified: Secondary | ICD-10-CM | POA: Diagnosis not present

## 2018-01-29 DIAGNOSIS — R7309 Other abnormal glucose: Secondary | ICD-10-CM | POA: Diagnosis not present

## 2018-01-29 LAB — URINALYSIS
Bilirubin Urine: NEGATIVE
Ketones, ur: NEGATIVE
Leukocytes, UA: NEGATIVE
Nitrite: NEGATIVE
TOTAL PROTEIN, URINE-UPE24: NEGATIVE
Urine Glucose: NEGATIVE
Urobilinogen, UA: 0.2 (ref 0.0–1.0)
pH: 6.5 (ref 5.0–8.0)

## 2018-01-29 LAB — BASIC METABOLIC PANEL
BUN: 12 mg/dL (ref 6–23)
CALCIUM: 9.3 mg/dL (ref 8.4–10.5)
CO2: 29 meq/L (ref 19–32)
Chloride: 104 mEq/L (ref 96–112)
Creatinine, Ser: 0.99 mg/dL (ref 0.40–1.50)
GFR: 83.21 mL/min (ref >60.00)
Glucose, Bld: 99 mg/dL (ref 70–99)
Potassium: 4.3 mEq/L (ref 3.5–5.1)
SODIUM: 140 meq/L (ref 135–145)

## 2018-01-29 LAB — TSH: TSH: 2.69 u[IU]/mL (ref 0.35–4.50)

## 2018-01-29 LAB — HEPATIC FUNCTION PANEL
ALBUMIN: 4.2 g/dL (ref 3.5–5.2)
ALT: 20 U/L (ref 0–53)
AST: 17 U/L (ref 0–37)
Alkaline Phosphatase: 60 U/L (ref 39–117)
Bilirubin, Direct: 0.1 mg/dL (ref 0.0–0.3)
TOTAL PROTEIN: 6.5 g/dL (ref 6.0–8.3)
Total Bilirubin: 0.4 mg/dL (ref 0.2–1.2)

## 2018-01-29 LAB — CBC WITH DIFFERENTIAL/PLATELET
BASOS PCT: 0.9 % (ref 0.0–3.0)
Basophils Absolute: 0.1 10*3/uL (ref 0.0–0.1)
EOS ABS: 0.2 10*3/uL (ref 0.0–0.7)
Eosinophils Relative: 2.3 % (ref 0.0–5.0)
HEMATOCRIT: 42.3 % (ref 39.0–52.0)
Hemoglobin: 14.4 g/dL (ref 13.0–17.0)
LYMPHS PCT: 21.5 % (ref 12.0–46.0)
Lymphs Abs: 1.5 10*3/uL (ref 0.7–4.0)
MCHC: 34 g/dL (ref 30.0–36.0)
MCV: 90.6 fl (ref 78.0–100.0)
Monocytes Absolute: 0.8 10*3/uL (ref 0.1–1.0)
Monocytes Relative: 10.8 % (ref 3.0–12.0)
NEUTROS ABS: 4.6 10*3/uL (ref 1.4–7.7)
Neutrophils Relative %: 64.5 % (ref 43.0–77.0)
Platelets: 225 10*3/uL (ref 150.0–400.0)
RBC: 4.67 Mil/uL (ref 4.22–5.81)
RDW: 13.5 % (ref 11.5–15.5)
WBC: 7.1 10*3/uL (ref 4.0–10.5)

## 2018-01-29 LAB — LIPID PANEL
CHOLESTEROL: 183 mg/dL (ref 0–200)
HDL: 45.8 mg/dL (ref >39.00)
LDL Cholesterol: 110 mg/dL — ABNORMAL HIGH (ref 0–99)
NonHDL: 137.15
TRIGLYCERIDES: 134 mg/dL (ref 0.0–149.0)
Total CHOL/HDL Ratio: 4
VLDL: 26.8 mg/dL (ref 0.0–40.0)

## 2018-01-29 LAB — PSA: PSA: 0.42 ng/mL (ref 0.10–4.00)

## 2018-01-31 ENCOUNTER — Ambulatory Visit (INDEPENDENT_AMBULATORY_CARE_PROVIDER_SITE_OTHER): Payer: Federal, State, Local not specified - PPO | Admitting: Internal Medicine

## 2018-01-31 ENCOUNTER — Encounter: Payer: Self-pay | Admitting: Internal Medicine

## 2018-01-31 VITALS — BP 126/78 | HR 89 | Temp 97.9°F | Ht 74.0 in | Wt 206.0 lb

## 2018-01-31 DIAGNOSIS — Z0000 Encounter for general adult medical examination without abnormal findings: Secondary | ICD-10-CM

## 2018-01-31 DIAGNOSIS — E785 Hyperlipidemia, unspecified: Secondary | ICD-10-CM

## 2018-01-31 DIAGNOSIS — I700 Atherosclerosis of aorta: Secondary | ICD-10-CM | POA: Diagnosis not present

## 2018-01-31 DIAGNOSIS — E034 Atrophy of thyroid (acquired): Secondary | ICD-10-CM | POA: Diagnosis not present

## 2018-01-31 DIAGNOSIS — Z720 Tobacco use: Secondary | ICD-10-CM

## 2018-01-31 DIAGNOSIS — Z23 Encounter for immunization: Secondary | ICD-10-CM | POA: Diagnosis not present

## 2018-01-31 NOTE — Patient Instructions (Signed)

## 2018-01-31 NOTE — Assessment & Plan Note (Signed)
  CT Ca scoring offered

## 2018-01-31 NOTE — Assessment & Plan Note (Signed)
Discussed.

## 2018-01-31 NOTE — Assessment & Plan Note (Signed)
We discussed age appropriate health related issues, including available/recomended screening tests and vaccinations. We discussed a need for adhering to healthy diet and exercise. Labs ordered. All questions were answered. 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

## 2018-01-31 NOTE — Assessment & Plan Note (Signed)
On Levothroid 

## 2018-01-31 NOTE — Progress Notes (Signed)
Subjective:  Patient ID: [name] [name], male    DOB: 1960/07/11  Age: 57 y.o. MRN: [id number]  CC: No chief complaint on file.   HPI DYLLEN MENNING presents for a well exam  Outpatient Medications Prior to Visit  Medication Sig Dispense Refill  . Albuterol Sulfate (PROAIR RESPICLICK) 108 (90 Base) MCG/ACT AEPB Inhale 1-2 puffs into the lungs 4 (four) times daily as needed. 1 each 5  . ANORO ELLIPTA 62.5-25 MCG/INH AEPB INHALE ONE ACTUATION INTO THE LUNGS DAILY 60 each 11  . Armodafinil 150 MG tablet TAKE 1 TABLET EVERY MORNING 90 tablet 1  . cholecalciferol (VITAMIN D) 1000 UNITS tablet Take 1,000 Units by mouth daily.      Marland Kitchen levothyroxine (SYNTHROID, LEVOTHROID) 137 MCG tablet TAKE ONE TABLET BY MOUTH ONE TIME DAILY 90 tablet 3  . montelukast (SINGULAIR) 10 MG tablet TAKE 1 TABLET BY MOUTH EVERY DAY 90 tablet 2  . omeprazole (PRILOSEC) 40 MG capsule TAKE 1 CAPSULE BY MOUTH EVERY DAY 90 capsule 1  . simvastatin (ZOCOR) 40 MG tablet Take 1 tablet (40 mg total) by mouth daily. Annual appt due in Oct must see provider for future refills 90 tablet 0   No facility-administered medications prior to visit.     ROS: Review of Systems  Constitutional: Negative for appetite change, fatigue and unexpected weight change.  HENT: Negative for congestion, nosebleeds, sneezing, sore throat and trouble swallowing.   Eyes: Negative for itching and visual disturbance.  Respiratory: Negative for cough.   Cardiovascular: Negative for chest pain, palpitations and leg swelling.  Gastrointestinal: Negative for abdominal distention, blood in stool, diarrhea and nausea.  Genitourinary: Negative for frequency and hematuria.  Musculoskeletal: Negative for back pain, gait problem, joint swelling and neck pain.  Skin: Negative for rash.  Neurological: Negative for dizziness, tremors, speech difficulty and weakness.  Psychiatric/Behavioral: Negative for agitation, dysphoric mood, sleep disturbance and  suicidal ideas. The patient is not nervous/anxious.     Objective:  BP 126/78 (BP Location: Left Arm, Patient Position: Sitting, Cuff Size: Normal)   Pulse 89   Temp 97.9 F (36.6 C) (Oral)   Ht 6\' 2"  (1.88 m)   Wt 206 lb (93.4 kg)   SpO2 98%   BMI 26.45 kg/m   BP Readings from Last 3 Encounters:  01/31/18 126/78  08/01/17 128/82  01/30/17 132/80    Wt Readings from Last 3 Encounters:  01/31/18 206 lb (93.4 kg)  08/01/17 207 lb (93.9 kg)  01/30/17 210 lb (95.3 kg)    Physical Exam  Constitutional: He is oriented to person, place, and time. He appears well-developed. No distress.  NAD  HENT:  Mouth/Throat: Oropharynx is clear and moist.  Eyes: Pupils are equal, round, and reactive to light. Conjunctivae are normal.  Neck: Normal range of motion. No JVD present. No thyromegaly present.  Cardiovascular: Normal rate, regular rhythm, normal heart sounds and intact distal pulses. Exam reveals no gallop and no friction rub.  No murmur heard. Pulmonary/Chest: Effort normal and breath sounds normal. No respiratory distress. He has no wheezes. He has no rales. He exhibits no tenderness.  Abdominal: Soft. Bowel sounds are normal. He exhibits no distension and no mass. There is no tenderness. There is no rebound and no guarding.  Musculoskeletal: Normal range of motion. He exhibits no edema or tenderness.  Lymphadenopathy:    He has no cervical adenopathy.  Neurological: He is alert and oriented to person, place, and time. He has normal reflexes. No  cranial nerve deficit. He exhibits normal muscle tone. He displays a negative Romberg sign. Coordination and gait normal.  Skin: Skin is warm and dry. No rash noted.  Psychiatric: He has a normal mood and affect. His behavior is normal. Judgment and thought content normal.    Lab Results  Component Value Date   WBC 7.1 01/29/2018   HGB 14.4 01/29/2018   HCT 42.3 01/29/2018   PLT 225.0 01/29/2018   GLUCOSE 99 01/29/2018   CHOL 183  01/29/2018   TRIG 134.0 01/29/2018   HDL 45.80 01/29/2018   LDLDIRECT 135.6 01/25/2012   LDLCALC 110 (H) 01/29/2018   ALT 20 01/29/2018   AST 17 01/29/2018   NA 140 01/29/2018   K 4.3 01/29/2018   CL 104 01/29/2018   CREATININE 0.99 01/29/2018   BUN 12 01/29/2018   CO2 29 01/29/2018   TSH 2.69 01/29/2018   PSA 0.42 01/29/2018   INR 0.9 RATIO 01/28/2007   HGBA1C 6.4 07/27/2016    Dg Chest 2 View  Result Date: 04/20/2016 CLINICAL DATA:  Cough, chest congestion, and dyspnea for the past 6 weeks. History of pneumonia and COPD. Current smoker. EXAM: CHEST  2 VIEW COMPARISON:  Chest x-ray of October 27, 2013 FINDINGS: The lungs are mildly hyperinflated. There is stable scarring in the left mid and lower lung. There is no alveolar infiltrate or pleural effusion. The heart and pulmonary vascularity are normal. There is calcification in the wall of the aortic arch. There is no pleural effusion. The bony thorax exhibits no acute abnormality. IMPRESSION: Chronic emphysematous changes with scarring on the left. No alveolar pneumonia, CHF, nor other acute cardiopulmonary abnormality. Thoracic aortic atherosclerosis. Electronically Signed   By: David  [place] M.D.   On: 04/20/2016 10:13    Assessment & Plan:   There are no diagnoses linked to this encounter.   No orders of the defined types were placed in this encounter.    Follow-up: No follow-ups on file.  Sonda Primes, MD

## 2018-01-31 NOTE — Assessment & Plan Note (Signed)
Simvastatin, ASA 

## 2018-02-28 ENCOUNTER — Ambulatory Visit: Payer: Self-pay | Admitting: *Deleted

## 2018-02-28 ENCOUNTER — Ambulatory Visit: Payer: Federal, State, Local not specified - PPO | Admitting: Internal Medicine

## 2018-02-28 ENCOUNTER — Ambulatory Visit (INDEPENDENT_AMBULATORY_CARE_PROVIDER_SITE_OTHER)
Admission: RE | Admit: 2018-02-28 | Discharge: 2018-02-28 | Disposition: A | Discharge status: Home / Self Care | Payer: Federal, State, Local not specified - PPO | Source: Ambulatory Visit | Attending: Internal Medicine | Admitting: Internal Medicine

## 2018-02-28 ENCOUNTER — Encounter: Payer: Self-pay | Admitting: Internal Medicine

## 2018-02-28 VITALS — BP 138/84 | HR 105 | Temp 99.3°F | Resp 18 | Ht 74.0 in | Wt 205.8 lb

## 2018-02-28 DIAGNOSIS — J209 Acute bronchitis, unspecified: Secondary | ICD-10-CM | POA: Diagnosis not present

## 2018-02-28 DIAGNOSIS — J441 Chronic obstructive pulmonary disease with (acute) exacerbation: Secondary | ICD-10-CM | POA: Diagnosis not present

## 2018-02-28 DIAGNOSIS — R0602 Shortness of breath: Secondary | ICD-10-CM | POA: Diagnosis not present

## 2018-02-28 DIAGNOSIS — R05 Cough: Secondary | ICD-10-CM | POA: Diagnosis not present

## 2018-02-28 MED ORDER — METHYLPREDNISOLONE ACETATE 80 MG/ML IJ SUSP
80.0000 mg | Freq: Once | INTRAMUSCULAR | Status: AC
  Administered 2018-02-28: 80 mg via INTRAMUSCULAR

## 2018-02-28 MED ORDER — DOXYCYCLINE HYCLATE 100 MG PO TABS: 100.0000 mg | ORAL_TABLET | Freq: Two times a day (BID) | ORAL | 0 refills | Status: DC

## 2018-02-28 NOTE — Assessment & Plan Note (Addendum)
Depo-Medrol 80 mg IM x1 Experiencing shortness of breath and wheezing Using an oral daily and albuterol as needed, but using it excessively the past couple of days Doxycycline started for acute bronchitis Chest x-ray to rule out pneumonia

## 2018-02-28 NOTE — Patient Instructions (Addendum)
Have a chest xray today.   We will call you with the results.    You had a steroid injection today.  Continue your inhalers.   Start the antibiotic -doxycycline - complete the entire course.     Call if no improvement

## 2018-02-28 NOTE — Telephone Encounter (Signed)
Seeing you today at 11:15

## 2018-02-28 NOTE — Assessment & Plan Note (Addendum)
Likely bacterial Chest x-ray to rule out pneumonia Start doxycyline Depo-Medrol 80 mg IM x1 for COPD exacerbation secondary to bronchitis/pneumonia otc cold medications Rest, fluid Call if no improvement

## 2018-02-28 NOTE — Telephone Encounter (Signed)
Patient is experiencing shortness of breath with a dry non productive cough since Monday. Reports Inspiratory wheeze he has used inhaler occasionally that helps temporarily. Also has intermittent dull soreness in upper left chest that presented a day after other symptoms. He had a 5 hour flight on 10/24 visiting family in ICU environment and flight home on 02/24/18.Hx of COPD and has a Pox of 93% this morning, does not use O2. Reason for Disposition . Recent long-distance travel with prolonged time in car, bus, plane, or train (i.e., within past 2 weeks; 6 or  more hours duration) . [1] MILD difficulty breathing (e.g., minimal/no SOB at rest, SOB with walking, pulse <100) AND [2] NEW-onset or WORSE than normal  Answer Assessment - Initial Assessment Questions 1. RESPIRATORY STATUS: "Describe your breathing?" (e.g., wheezing, shortness of breath, unable to speak, severe coughing)      Wheezing, coughing 2. ONSET: "When did this breathing problem begin?"      4-5 days ago after visiting friend in CCU out of state. 3. PATTERN "Does the difficult breathing come and go, or has it been constant since it started?"      Constant. 4. SEVERITY: "How bad is your breathing?" (e.g., mild, moderate, severe)    - MILD: No SOB at rest, mild SOB with walking, speaks normally in sentences, can lay down, no retractions, pulse < 100.    - MODERATE: SOB at rest, SOB with minimal exertion and prefers to sit, cannot lie down flat, speaks in phrases, mild retractions, audible wheezing, pulse 100-120.    - SEVERE: Very SOB at rest, speaks in single words, struggling to breathe, sitting hunched forward, retractions, pulse > 120      moderate 5. RECURRENT SYMPTOM: "Have you had difficulty breathing before?" If so, ask: "When was the last time?" and "What happened that time?"      Yes, long time ago. 6. CARDIAC HISTORY: "Do you have any history of heart disease?" (e.g., heart attack, angina, bypass surgery, angioplasty)    no 7. LUNG HISTORY: "Do you have any history of lung disease?"  (e.g., pulmonary embolus, asthma, emphysema)     COPD 8. CAUSE: "What do you think is causing the breathing problem?"      Exposed to illness with a recent visit(friend) to out of state hospital  9. OTHER SYMPTOMS: "Do you have any other symptoms? (e.g., dizziness, runny nose, cough, chest pain, fever)     Cough non productive, no fever. Upper left chest dull pain intermittently that does not radiate. Started just after the SOB started. 10. PREGNANCY: "Is there any chance you are pregnant?" "When was your last menstrual period?"       no 11. TRAVEL: "Have you traveled out of the country in the last month?" (e.g., travel history, exposures)      Travelled to [place] by plane 10/24 and returned 02/25/18. 5 hour flight.  Protocols used: BREATHING DIFFICULTY-A-AH

## 2018-02-28 NOTE — Progress Notes (Signed)
Subjective:    Patient ID: [name] [name], male    DOB: 1961/03/22, 57 y.o.   MRN: [id number]  HPI He is here for an acute visit for cold symptoms.  His symptoms started 3 days ago after flying across the country.    He is experiencing wheezing in left upper chest, dry cough, sob and mild nasal congestion, which is fairly chronic.  He has a low-grade fever here today, but denies any known fever or chills.  He denies sinus pain, sore throat, ear pain, headaches and lightheadedness.  His shortness of breath and wheezing are getting worse.  He has tried taking proair, which helps temporarily.  He is uses the anoro daily.     He is smoking.   Medications and allergies reviewed with patient and updated if appropriate.  Patient Active Problem List   Diagnosis Date Noted  . Acute bronchitis 02/28/2018  . COPD exacerbation (HCC) 02/28/2018  . Allergic rhinitis 01/30/2017  . Thoracic aorta atherosclerosis (HCC) 07/31/2016  . Elevated glucose 07/27/2015  . Acute sinusitis 07/27/2015  . OSA on CPAP 03/29/2015  . Ganglion cyst 11/25/2014  . Fatigue 08/26/2014  . Well adult exam 01/31/2012  . Tobacco use 01/31/2012  . Right rotator cuff tear 01/31/2012  . Dysphagia 01/04/2011  . FOREIGN BODY GRANULOMA SKIN&SUBCUTANEOUS TISSUE 02/11/2010  . NEPHROLITHIASIS 03/15/2009  . LOW BACK PAIN 03/15/2009  . EMPHYSEMA, BULLOUS 11/23/2008  . Hypothyroidism 05/29/2008  . Dyslipidemia 05/29/2008  . Adjustment disorder with mixed anxiety and depressed mood 05/29/2008  . OSTEOARTHRITIS 05/29/2008    Current Outpatient Medications on File Prior to Visit  Medication Sig Dispense Refill  . Albuterol Sulfate (PROAIR RESPICLICK) 108 (90 Base) MCG/ACT AEPB Inhale 1-2 puffs into the lungs 4 (four) times daily as needed. 1 each 5  . ANORO ELLIPTA 62.5-25 MCG/INH AEPB INHALE ONE ACTUATION INTO THE LUNGS DAILY 60 each 11  . Armodafinil 150 MG tablet TAKE 1 TABLET EVERY MORNING 90 tablet 1  .  cholecalciferol (VITAMIN D) 1000 UNITS tablet Take 1,000 Units by mouth daily.      Marland Kitchen levothyroxine (SYNTHROID, LEVOTHROID) 137 MCG tablet TAKE ONE TABLET BY MOUTH ONE TIME DAILY 90 tablet 3  . montelukast (SINGULAIR) 10 MG tablet TAKE 1 TABLET BY MOUTH EVERY DAY 90 tablet 2  . omeprazole (PRILOSEC) 40 MG capsule TAKE 1 CAPSULE BY MOUTH EVERY DAY 90 capsule 1  . simvastatin (ZOCOR) 40 MG tablet Take 1 tablet (40 mg total) by mouth daily. Annual appt due in Oct must see provider for future refills 90 tablet 0   No current facility-administered medications on file prior to visit.     Past Medical History:  Diagnosis Date  . ACL tear    Bilateral- Dr. Wyline Mood  . Alcohol abuse   . Asthma   . Depression   . Hyperlipidemia   . Hypothyroidism   . Kidney stone    hx  . OA (osteoarthritis)     Past Surgical History:  Procedure Laterality Date  . FINGER SURGERY    . NASAL SINUS SURGERY  1988/2008    Social History   Socioeconomic History  . Marital status: Married    Spouse name: Not on file  . Number of children: Not on file  . Years of education: Not on file  . Highest education level: Not on file  Occupational History  . 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 Financial resource strain: Not on  file  . Food insecurity:    Worry: Not on file    Inability: Not on file  . Transportation needs:    Medical: Not on file    Non-medical: Not on file  Tobacco Use  . Smoking status: Current Every Day Smoker    Packs/day: 0.50  . Smokeless tobacco: Never Used  Substance and Sexual Activity  . Alcohol use: No  . Drug use: No  . Sexual activity: Not Currently  Lifestyle  . Physical activity:    Days per week: Not on file    Minutes per session: Not on file  . Stress: Not on file  Relationships  . Social connections:    Talks on phone: Not on file    Gets together: Not on file    Attends religious service: Not on file    Active member of club or  organization: Not on file    Attends meetings of clubs or organizations: Not on file    Relationship status: Not on file  Other Topics Concern  . Not on file  Social History Narrative  . Not on file    Family History  Problem Relation Age of Onset  . Heart disease Mother        valve surgery    Review of Systems  Constitutional: Negative for chills and fever.  HENT: Positive for congestion (mild and chronic). Negative for ear pain, sinus pressure, sneezing and sore throat.   Respiratory: Positive for cough, shortness of breath and wheezing. Negative for chest tightness.   Neurological: Negative for dizziness, light-headedness and headaches.       Objective:   Vitals:   02/28/18 1117  BP: 138/84  Pulse: (!) 105  Resp: 18  Temp: 99.3 F (37.4 C)  SpO2: 98%   BP Readings from Last 3 Encounters:  02/28/18 138/84  01/31/18 126/78  08/01/17 128/82   Wt Readings from Last 3 Encounters:  02/28/18 205 lb 12.8 oz (93.4 kg)  01/31/18 206 lb (93.4 kg)  08/01/17 207 lb (93.9 kg)   Body mass index is 26.42 kg/m.   Physical Exam    GENERAL APPEARANCE: Appears stated age, well appearing, NAD EYES: conjunctiva clear, no icterus HEENT: bilateral tympanic membranes and ear canals normal, oropharynx with mild erythema, no thyromegaly, trachea midline, no cervical or supraclavicular lymphadenopathy LUNGS:  unlabored breathing, good air entry bilaterally, mild crackles left lung base, no obvious wheeze CARDIOVASCULAR: Normal S1,S2 without murmurs, no edema SKIN: Warm, dry      Assessment & Plan:    See Problem List for Assessment and Plan of chronic medical problems.

## 2018-03-10 ENCOUNTER — Other Ambulatory Visit: Payer: Self-pay | Admitting: Internal Medicine

## 2018-04-22 ENCOUNTER — Other Ambulatory Visit: Payer: Self-pay | Admitting: Internal Medicine

## 2018-04-22 DIAGNOSIS — Z9989 Dependence on other enabling machines and devices: Secondary | ICD-10-CM | POA: Diagnosis not present

## 2018-04-22 DIAGNOSIS — G4733 Obstructive sleep apnea (adult) (pediatric): Secondary | ICD-10-CM | POA: Diagnosis not present

## 2018-04-22 MED ORDER — ARMODAFINIL 150 MG PO TABS: 150.0000 mg | ORAL_TABLET | Freq: Every morning | ORAL | 1 refills | Status: DC

## 2018-04-22 NOTE — Telephone Encounter (Signed)
Faxed rx manually after MD signed to [email or web address] Chute/lmb

## 2018-04-22 NOTE — Addendum Note (Signed)
Addended by: Deatra JamesBRAND, Murice Barbar M on: 04/22/2018 11:09 AM   Modules accepted: Orders

## 2018-04-30 ENCOUNTER — Other Ambulatory Visit: Payer: Self-pay | Admitting: Internal Medicine

## 2018-04-30 MED ORDER — ARMODAFINIL 150 MG PO TABS: 150.0000 mg | ORAL_TABLET | Freq: Every morning | ORAL | 1 refills | Status: DC

## 2018-05-01 DIAGNOSIS — J208 Acute bronchitis due to other specified organisms: Secondary | ICD-10-CM | POA: Diagnosis not present

## 2018-05-01 DIAGNOSIS — J329 Chronic sinusitis, unspecified: Secondary | ICD-10-CM | POA: Diagnosis not present

## 2018-05-01 DIAGNOSIS — Z8709 Personal history of other diseases of the respiratory system: Secondary | ICD-10-CM | POA: Diagnosis not present

## 2018-05-01 DIAGNOSIS — B9689 Other specified bacterial agents as the cause of diseases classified elsewhere: Secondary | ICD-10-CM | POA: Diagnosis not present

## 2018-05-20 ENCOUNTER — Other Ambulatory Visit: Payer: Self-pay | Admitting: Internal Medicine

## 2018-05-24 NOTE — Telephone Encounter (Signed)
Key: [id number]

## 2018-06-04 DIAGNOSIS — G4733 Obstructive sleep apnea (adult) (pediatric): Secondary | ICD-10-CM | POA: Diagnosis not present

## 2018-06-21 ENCOUNTER — Other Ambulatory Visit: Payer: Self-pay | Admitting: Internal Medicine

## 2018-06-24 ENCOUNTER — Ambulatory Visit: Payer: Self-pay | Admitting: *Deleted

## 2018-06-24 NOTE — Telephone Encounter (Signed)
Pt calling wit complaints of shortness of breath that started yesterday afternoon. Pt states he does have a runny nose but denies a cough, dizziness or chest pain at this time. Pt has a history of COPD.Pt states he is currently in [place] and will be flying home tonight and should be in [place] around 3 am. Pt scheduled for appt with Dr. Posey Rea on tomorrow and advised to seek treatment in the ED with worsening symptoms. Pt verbalized understanding.  Reason for Disposition . [1] MODERATE longstanding difficulty breathing (e.g., speaks in phrases, SOB even at rest, pulse 100-120) AND [2] SAME as normal  Answer Assessment - Initial Assessment Questions 1. RESPIRATORY STATUS: "Describe your breathing?" (e.g., wheezing, shortness of breath, unable to speak, severe coughing)      Shortness of breath with talking 2. ONSET: "When did this breathing problem begin?"      yesterday 3. PATTERN "Does the difficult breathing come and go, or has it been constant since it started?"      comes and goes with talking 4. SEVERITY: "How bad is your breathing?" (e.g., mild, moderate, severe)    - MILD: No SOB at rest, mild SOB with walking, speaks normally in sentences, can lay down, no retractions, pulse < 100.    - MODERATE: SOB at rest, SOB with minimal exertion and prefers to sit, cannot lie down flat, speaks in phrases, mild retractions, audible wheezing, pulse 100-120.    - SEVERE: Very SOB at rest, speaks in single words, struggling to breathe, sitting hunched forward, retractions, pulse > 120      moderate 5. RECURRENT SYMPTOM: "Have you had difficulty breathing before?" If so, ask: "When was the last time?" and "What happened that time?"      Yes has a history of COPD 6. CARDIAC HISTORY: "Do you have any history of heart disease?" (e.g., heart attack, angina, bypass surgery, angioplasty)      No 7. LUNG HISTORY: "Do you have any history of lung disease?"  (e.g., pulmonary embolus, asthma, emphysema)   COPD, PE could not be confirmed 8-9 years ago 8. CAUSE: "What do you think is causing the breathing problem?"      unknown 9. OTHER SYMPTOMS: "Do you have any other symptoms? (e.g., dizziness, runny nose, cough, chest pain, fever)     Runny nose 10. TRAVEL: "Have you traveled out of the country in the last month?" (e.g., travel history, exposures)       No travel outside of the country, but pt states he is currently in [place] and will be flying home tonight and returning to [place] on tomorrow.  Protocols used: BREATHING DIFFICULTY-A-AH

## 2018-06-25 ENCOUNTER — Encounter: Payer: Self-pay | Admitting: Internal Medicine

## 2018-06-25 ENCOUNTER — Ambulatory Visit: Payer: Federal, State, Local not specified - PPO | Admitting: Internal Medicine

## 2018-06-25 VITALS — BP 132/74 | HR 112 | Temp 98.7°F | Ht 74.0 in | Wt 210.0 lb

## 2018-06-25 DIAGNOSIS — R079 Chest pain, unspecified: Secondary | ICD-10-CM | POA: Diagnosis not present

## 2018-06-25 DIAGNOSIS — R6889 Other general symptoms and signs: Secondary | ICD-10-CM

## 2018-06-25 DIAGNOSIS — J069 Acute upper respiratory infection, unspecified: Secondary | ICD-10-CM

## 2018-06-25 DIAGNOSIS — E785 Hyperlipidemia, unspecified: Secondary | ICD-10-CM | POA: Diagnosis not present

## 2018-06-25 DIAGNOSIS — R7309 Other abnormal glucose: Secondary | ICD-10-CM

## 2018-06-25 LAB — POC INFLUENZA A&B (BINAX/QUICKVUE)
INFLUENZA A, POC: POSITIVE — AB
Influenza B, POC: NEGATIVE

## 2018-06-25 MED ORDER — OSELTAMIVIR PHOSPHATE 75 MG PO CAPS: 75.0000 mg | ORAL_CAPSULE | Freq: Two times a day (BID) | ORAL | 0 refills | Status: DC

## 2018-06-25 NOTE — Assessment & Plan Note (Signed)
flu type A (+) Tamiflu

## 2018-06-25 NOTE — Assessment & Plan Note (Signed)
EKG

## 2018-06-25 NOTE — Progress Notes (Signed)
Subjective:  Patient ID: [name] [name], male    DOB: 11-05-1960  Age: 58 y.o. MRN: [id number]  CC: No chief complaint on file.   HPI [name] [name] presents for SOB, chills, weakness  He just had a 5k race at [facility] in [place] (he walked) on Friday. Smoking 6/d  Outpatient Medications Prior to Visit  Medication Sig Dispense Refill  . Albuterol Sulfate (PROAIR RESPICLICK) 108 (90 Base) MCG/ACT AEPB Inhale 1-2 puffs into the lungs 4 (four) times daily as needed. 1 each 5  . ANORO ELLIPTA 62.5-25 MCG/INH AEPB INHALE ONE ACTUATION INTO THE LUNGS DAILY 60 each 11  . Armodafinil 150 MG tablet Take 1 tablet (150 mg total) by mouth every morning. 90 tablet 1  . cholecalciferol (VITAMIN D) 1000 UNITS tablet Take 1,000 Units by mouth daily.      Marland Kitchen levothyroxine (SYNTHROID, LEVOTHROID) 137 MCG tablet TAKE ONE TABLET BY MOUTH ONE TIME DAILY 90 tablet 2  . montelukast (SINGULAIR) 10 MG tablet TAKE 1 TABLET BY MOUTH EVERY DAY 90 tablet 2  . omeprazole (PRILOSEC) 40 MG capsule TAKE 1 CAPSULE BY MOUTH EVERY DAY 90 capsule 3  . simvastatin (ZOCOR) 40 MG tablet Take 1 tablet (40 mg total) by mouth daily. 90 tablet 1  . doxycycline (VIBRA-TABS) 100 MG tablet Take 1 tablet (100 mg total) by mouth 2 (two) times daily. 20 tablet 0   No facility-administered medications prior to visit.     ROS: Review of Systems  Constitutional: Positive for chills and fatigue. Negative for appetite change, fever and unexpected weight change.  HENT: Negative for congestion, nosebleeds, sneezing, sore throat and trouble swallowing.   Eyes: Negative for itching and visual disturbance.  Respiratory: Positive for cough, shortness of breath and wheezing.   Cardiovascular: Positive for chest pain. Negative for palpitations and leg swelling.  Gastrointestinal: Negative for abdominal distention, blood in stool, diarrhea and nausea.  Genitourinary: Negative for frequency and hematuria.  Musculoskeletal: Positive for  arthralgias. Negative for back pain, gait problem, joint swelling and neck pain.  Skin: Negative for rash.  Neurological: Positive for weakness. Negative for dizziness, tremors, speech difficulty and headaches.  Psychiatric/Behavioral: Negative for agitation, dysphoric mood, sleep disturbance and suicidal ideas. The patient is not nervous/anxious.     Objective:  BP 132/74 (BP Location: Left Arm, Patient Position: Sitting, Cuff Size: Large)   Pulse (!) 112   Temp 98.7 F (37.1 C) (Oral)   Ht 6\' 2"  (1.88 m)   Wt 210 lb (95.3 kg)   SpO2 98%   BMI 26.96 kg/m   BP Readings from Last 3 Encounters:  06/25/18 132/74  02/28/18 138/84  01/31/18 126/78    Wt Readings from Last 3 Encounters:  06/25/18 210 lb (95.3 kg)  02/28/18 205 lb 12.8 oz (93.4 kg)  01/31/18 206 lb (93.4 kg)    Physical Exam Constitutional:      General: He is not in acute distress.    Appearance: Normal appearance. He is well-developed. He is not ill-appearing, toxic-appearing or diaphoretic.     Comments: NAD  HENT:     Mouth/Throat:     Mouth: Mucous membranes are dry.     Pharynx: Posterior oropharyngeal erythema present.  Eyes:     Conjunctiva/sclera: Conjunctivae normal.     Pupils: Pupils are equal, round, and reactive to light.  Neck:     Musculoskeletal: Normal range of motion.     Thyroid: No thyromegaly.     Vascular: No JVD.  Cardiovascular:     Rate and Rhythm: Normal rate and regular rhythm.     Heart sounds: Normal heart sounds. No murmur. No friction rub. No gallop.   Pulmonary:     Effort: Pulmonary effort is normal. No respiratory distress.     Breath sounds: Normal breath sounds. No wheezing or rales.  Chest:     Chest wall: No tenderness.  Abdominal:     General: Bowel sounds are normal. There is no distension.     Palpations: Abdomen is soft. There is no mass.     Tenderness: There is no abdominal tenderness. There is no guarding or rebound.  Musculoskeletal: Normal range of  motion.        General: No tenderness.  Lymphadenopathy:     Cervical: No cervical adenopathy.  Skin:    General: Skin is warm and dry.     Findings: No rash.  Neurological:     Mental Status: He is alert and oriented to person, place, and time.     Cranial Nerves: No cranial nerve deficit.     Motor: No abnormal muscle tone.     Coordination: Coordination normal.     Gait: Gait normal.     Deep Tendon Reflexes: Reflexes are normal and symmetric.  Psychiatric:        Behavior: Behavior normal.        Thought Content: Thought content normal.        Judgment: Judgment normal.   eryth throat  Procedure: EKG Indication: chest pain/SOB Impression: S tachy. No acute changes.   Lab Results  Component Value Date   WBC 7.1 01/29/2018   HGB 14.4 01/29/2018   HCT 42.3 01/29/2018   PLT 225.0 01/29/2018   GLUCOSE 99 01/29/2018   CHOL 183 01/29/2018   TRIG 134.0 01/29/2018   HDL 45.80 01/29/2018   LDLDIRECT 135.6 01/25/2012   LDLCALC 110 (H) 01/29/2018   ALT 20 01/29/2018   AST 17 01/29/2018   NA 140 01/29/2018   K 4.3 01/29/2018   CL 104 01/29/2018   CREATININE 0.99 01/29/2018   BUN 12 01/29/2018   CO2 29 01/29/2018   TSH 2.69 01/29/2018   PSA 0.42 01/29/2018   INR 0.9 RATIO 01/28/2007   HGBA1C 6.4 07/27/2016    Dg Chest 2 View  Result Date: 02/28/2018 CLINICAL DATA:  Cough, congestion, short of breath, low-grade fever for 5 days, smoking history EXAM: CHEST - 2 VIEW COMPARISON:  Chest x-ray of 04/20/2016 and CT chest of 06/03/2009 FINDINGS: In review of the CT, the patient has multiple blebs throughout the lungs many of which are paraseptal in location. The largest bleb is adjacent to the left heart border. Currently this bleb appears to have an air-fluid level which indicates infection. No pneumonia is seen. However, there does appear to be a tiny left pleural effusion blunting the posterior left costophrenic angle. The heart is within normal limits in size. No acute bony  abnormality is seen. IMPRESSION: 1. Air-fluid level within large bleb adjacent to the left heart border most consistent with infected bleb. 2. No pneumonia.  Probable small left effusion. Electronically Signed   By: Dwyane Dee M.D.   On: 02/28/2018 12:07    Assessment & Plan:   There are no diagnoses linked to this encounter.   No orders of the defined types were placed in this encounter.    Follow-up: No follow-ups on file.  Sonda Primes, MD

## 2018-06-25 NOTE — Assessment & Plan Note (Addendum)
EKG  flu type A (+) Stop smoking

## 2018-06-25 NOTE — Assessment & Plan Note (Signed)
EKG [] Labs []

## 2018-06-25 NOTE — Assessment & Plan Note (Signed)
Labs

## 2018-06-25 NOTE — Patient Instructions (Addendum)
You can use over-the-counter  "cold" medicines  such as "Tylenol cold" , "Advil cold",  "Mucinex" or" Mucinex D"  for cough and congestion.   Avoid decongestants if you have high blood pressure and use "Afrin" nasal spray for nasal congestion as directed. Use " Delsym" or" Robitussin" cough syrup varietis for cough.  You can use plain "Tylenol" or "Advil" for fever, chills and achyness. Use Halls or Ricola cough drops.   Please, make an appointment if you are not better or if you're worse.  Influenza, Adult Influenza is also called "the flu." It is an infection in the lungs, nose, and throat (respiratory tract). It is caused by a virus. The flu causes symptoms that are similar to symptoms of a cold. It also causes a high fever and body aches. The flu spreads easily from person to person (is contagious). Getting a flu shot (influenza vaccination) every year is the best way to prevent the flu. What are the causes? This condition is caused by the influenza virus. You can get the virus by:  Breathing in droplets that are in the air from the cough or sneeze of a person who has the virus.  Touching something that has the virus on it (is contaminated) and then touching your mouth, nose, or eyes. What increases the risk? Certain things may make you more likely to get the flu. These include:  Not washing your hands often.  Having close contact with many people during cold and flu season.  Touching your mouth, eyes, or nose without first washing your hands.  Not getting a flu shot every year. You may have a higher risk for the flu, along with serious problems such as a lung infection (pneumonia), if you:  Are older than 65.  Are pregnant.  Have a weakened disease-fighting system (immune system) because of a disease or taking certain medicines.  Have a long-term (chronic) illness, such as: ? Heart, kidney, or lung disease. ? Diabetes. ? Asthma.  Have a liver disorder.  Are very  overweight (morbidly obese).  Have anemia. This is a condition that affects your red blood cells. What are the signs or symptoms? Symptoms usually begin suddenly and last 4-14 days. They may include:  Fever and chills.  Headaches, body aches, or muscle aches.  Sore throat.  Cough.  Runny or stuffy (congested) nose.  Chest discomfort.  Not wanting to eat as much as normal (poor appetite).  Weakness or feeling tired (fatigue).  Dizziness.  Feeling sick to your stomach (nauseous) or throwing up (vomiting). How is this treated? If the flu is found early, you can be treated with medicine that can help reduce how bad the illness is and how long it lasts (antiviral medicine). This may be given by mouth (orally) or through an IV tube. Taking care of yourself at home can help your symptoms get better. Your doctor may suggest:  Taking over-the-counter medicines.  Drinking plenty of fluids. The flu often goes away on its own. If you have very bad symptoms or other problems, you may be treated in a hospital. Follow these instructions at home:     Activity  Rest as needed. Get plenty of sleep.  Stay home from work or school as told by your doctor. ? Do not leave home until you do not have a fever for 24 hours without taking medicine. ? Leave home only to visit your doctor. Eating and drinking  Take an ORS (oral rehydration solution). This is a drink that  is sold at pharmacies and stores.  Drink enough fluid to keep your pee (urine) pale yellow.  Drink clear fluids in small amounts as you are able. Clear fluids include: ? Water. ? Ice chips. ? Fruit juice that has water added (diluted fruit juice). ? Low-calorie sports drinks.  Eat bland, easy-to-digest foods in small amounts as you are able. These foods include: ? Bananas. ? Applesauce. ? Rice. ? Lean meats. ? Toast. ? Crackers.  Do not eat or drink: ? Fluids that have a lot of sugar or  caffeine. ? Alcohol. ? Spicy or fatty foods. General instructions  Take over-the-counter and prescription medicines only as told by your doctor.  Use a cool mist humidifier to add moisture to the air in your home. This can make it easier for you to breathe.  Cover your mouth and nose when you cough or sneeze.  Wash your hands with soap and water often, especially after you cough or sneeze. If you cannot use soap and water, use alcohol-based hand sanitizer.  Keep all follow-up visits as told by your doctor. This is important. How is this prevented?   Get a flu shot every year. You may get the flu shot in late summer, fall, or winter. Ask your doctor when you should get your flu shot.  Avoid contact with people who are sick during fall and winter (cold and flu season). Contact a doctor if:  You get new symptoms.  You have: ? Chest pain. ? Watery poop (diarrhea). ? A fever.  Your cough gets worse.  You start to have more mucus.  You feel sick to your stomach.  You throw up. Get help right away if you:  Have shortness of breath.  Have trouble breathing.  Have skin or nails that turn a bluish color.  Have very bad pain or stiffness in your neck.  Get a sudden headache.  Get sudden pain in your face or ear.  Cannot eat or drink without throwing up. Summary  Influenza ("the flu") is an infection in the lungs, nose, and throat. It is caused by a virus.  Take over-the-counter and prescription medicines only as told by your doctor.  Getting a flu shot every year is the best way to avoid getting the flu. This information is not intended to replace advice given to you by your health care provider. Make sure you discuss any questions you have with your health care provider. Document Released: 01/25/2008 Document Revised: 10/03/2017 Document Reviewed: 10/03/2017 Elsevier Interactive Patient Education  2019 [employer].

## 2018-06-25 NOTE — Addendum Note (Signed)
Addended by: Scarlett Presto on: 06/25/2018 02:33 PM   Modules accepted: Orders

## 2018-08-05 ENCOUNTER — Ambulatory Visit: Payer: Federal, State, Local not specified - PPO | Admitting: Internal Medicine

## 2018-08-15 ENCOUNTER — Other Ambulatory Visit: Payer: Self-pay | Admitting: Internal Medicine

## 2018-09-04 ENCOUNTER — Ambulatory Visit: Payer: Federal, State, Local not specified - PPO | Admitting: Internal Medicine

## 2018-10-13 ENCOUNTER — Other Ambulatory Visit: Payer: Self-pay | Admitting: Internal Medicine

## 2018-10-22 ENCOUNTER — Encounter: Payer: Self-pay | Admitting: Internal Medicine

## 2018-10-22 ENCOUNTER — Other Ambulatory Visit: Payer: Self-pay

## 2018-10-22 ENCOUNTER — Ambulatory Visit (INDEPENDENT_AMBULATORY_CARE_PROVIDER_SITE_OTHER): Payer: Federal, State, Local not specified - PPO | Admitting: Internal Medicine

## 2018-10-22 ENCOUNTER — Other Ambulatory Visit (INDEPENDENT_AMBULATORY_CARE_PROVIDER_SITE_OTHER): Payer: Federal, State, Local not specified - PPO

## 2018-10-22 DIAGNOSIS — E785 Hyperlipidemia, unspecified: Secondary | ICD-10-CM | POA: Diagnosis not present

## 2018-10-22 DIAGNOSIS — F4321 Adjustment disorder with depressed mood: Secondary | ICD-10-CM | POA: Insufficient documentation

## 2018-10-22 DIAGNOSIS — I700 Atherosclerosis of aorta: Secondary | ICD-10-CM | POA: Diagnosis not present

## 2018-10-22 DIAGNOSIS — Z720 Tobacco use: Secondary | ICD-10-CM

## 2018-10-22 LAB — BASIC METABOLIC PANEL
BUN: 11 mg/dL (ref 6–23)
CO2: 28 mEq/L (ref 19–32)
Calcium: 9.4 mg/dL (ref 8.4–10.5)
Chloride: 104 mEq/L (ref 96–112)
Creatinine, Ser: 1 mg/dL (ref 0.40–1.50)
GFR: 77.18 mL/min (ref >60.00)
Glucose, Bld: 83 mg/dL (ref 70–99)
Potassium: 3.9 mEq/L (ref 3.5–5.1)
Sodium: 139 mEq/L (ref 135–145)

## 2018-10-22 LAB — TSH: TSH: 8.61 u[IU]/mL — ABNORMAL HIGH (ref 0.35–4.50)

## 2018-10-22 MED ORDER — PROAIR RESPICLICK 108 (90 BASE) MCG/ACT IN AEPB: 1.0000 | INHALATION_SPRAY | Freq: Four times a day (QID) | RESPIRATORY_TRACT | 5 refills | Status: DC | PRN

## 2018-10-22 NOTE — Assessment & Plan Note (Signed)
Discussed.

## 2018-10-22 NOTE — Progress Notes (Signed)
Subjective:  Patient ID: [name] [name], male    DOB: [birth year]  Age: 58 y.o. MRN: [id number]  CC: No chief complaint on file.   HPI [name] [name] presents for hypothyroidism, dyslipidemia, GERD f/u Mom passed away w/CHF  Outpatient Medications Prior to Visit  Medication Sig Dispense Refill   Albuterol Sulfate (PROAIR RESPICLICK) 108 (90 Base) MCG/ACT AEPB Inhale 1-2 puffs into the lungs 4 (four) times daily as needed. 1 each 5   ANORO ELLIPTA 62.5-25 MCG/INH AEPB INHALE ONE ACTUATION INTO THE LUNGS DAILY 60 each 11   Armodafinil 150 MG tablet Take 1 tablet (150 mg total) by mouth every morning. 90 tablet 1   cholecalciferol (VITAMIN D) 1000 UNITS tablet Take 1,000 Units by mouth daily.       levothyroxine (SYNTHROID, LEVOTHROID) 137 MCG tablet TAKE ONE TABLET BY MOUTH ONE TIME DAILY 90 tablet 2   montelukast (SINGULAIR) 10 MG tablet TAKE 1 TABLET BY MOUTH EVERY DAY 90 tablet 3   omeprazole (PRILOSEC) 40 MG capsule TAKE 1 CAPSULE BY MOUTH EVERY DAY 90 capsule 3   simvastatin (ZOCOR) 40 MG tablet Take 1 tablet (40 mg total) by mouth daily. 90 tablet 1   oseltamivir (TAMIFLU) 75 MG capsule Take 1 capsule (75 mg total) by mouth 2 (two) times daily. 10 capsule 0   No facility-administered medications prior to visit.     ROS: Review of Systems  Constitutional: Negative for appetite change, fatigue and unexpected weight change.  HENT: Negative for congestion, nosebleeds, sneezing, sore throat and trouble swallowing.   Eyes: Negative for itching and visual disturbance.  Respiratory: Negative for cough.   Cardiovascular: Negative for chest pain, palpitations and leg swelling.  Gastrointestinal: Negative for abdominal distention, blood in stool, diarrhea and nausea.  Genitourinary: Negative for frequency and hematuria.  Musculoskeletal: Negative for back pain, gait problem, joint swelling and neck pain.  Skin: Negative for rash.  Neurological: Negative for dizziness,  tremors, speech difficulty and weakness.  Psychiatric/Behavioral: Negative for agitation, dysphoric mood, sleep disturbance and suicidal ideas. The patient is not nervous/anxious.     Objective:  BP 126/74 (BP Location: Left Arm, Patient Position: Sitting, Cuff Size: Large)    Pulse 95    Temp 98 F (36.7 C) (Oral)    Ht 6\' 2"  (1.88 m)    Wt 214 lb (97.1 kg)    SpO2 96%    BMI 27.48 kg/m   BP Readings from Last 3 Encounters:  10/22/18 126/74  06/25/18 132/74  02/28/18 138/84    Wt Readings from Last 3 Encounters:  10/22/18 214 lb (97.1 kg)  06/25/18 210 lb (95.3 kg)  02/28/18 205 lb 12.8 oz (93.4 kg)    Physical Exam Constitutional:      General: He is not in acute distress.    Appearance: He is well-developed.     Comments: NAD  Eyes:     Conjunctiva/sclera: Conjunctivae normal.     Pupils: Pupils are equal, round, and reactive to light.  Neck:     Musculoskeletal: Normal range of motion.     Thyroid: No thyromegaly.     Vascular: No JVD.  Cardiovascular:     Rate and Rhythm: Normal rate and regular rhythm.     Heart sounds: Normal heart sounds. No murmur. No friction rub. No gallop.   Pulmonary:     Effort: Pulmonary effort is normal. No respiratory distress.     Breath sounds: Normal breath sounds. No wheezing or rales.  Chest:  Chest wall: No tenderness.  Abdominal:     General: Bowel sounds are normal. There is no distension.     Palpations: Abdomen is soft. There is no mass.     Tenderness: There is no abdominal tenderness. There is no guarding or rebound.  Musculoskeletal: Normal range of motion.        General: No tenderness.  Lymphadenopathy:     Cervical: No cervical adenopathy.  Skin:    General: Skin is warm and dry.     Findings: No rash.  Neurological:     Mental Status: He is alert and oriented to person, place, and time.     Cranial Nerves: No cranial nerve deficit.     Motor: No abnormal muscle tone.     Coordination: Coordination normal.      Gait: Gait normal.     Deep Tendon Reflexes: Reflexes are normal and symmetric.  Psychiatric:        Behavior: Behavior normal.        Thought Content: Thought content normal.        Judgment: Judgment normal.     Lab Results  Component Value Date   WBC 7.1 01/29/2018   HGB 14.4 01/29/2018   HCT 42.3 01/29/2018   PLT 225.0 01/29/2018   GLUCOSE 99 01/29/2018   CHOL 183 01/29/2018   TRIG 134.0 01/29/2018   HDL 45.80 01/29/2018   LDLDIRECT 135.6 01/25/2012   LDLCALC 110 (H) 01/29/2018   ALT 20 01/29/2018   AST 17 01/29/2018   NA 140 01/29/2018   K 4.3 01/29/2018   CL 104 01/29/2018   CREATININE 0.99 01/29/2018   BUN 12 01/29/2018   CO2 29 01/29/2018   TSH 2.69 01/29/2018   PSA 0.42 01/29/2018   INR 0.9 RATIO 01/28/2007   HGBA1C 6.4 07/27/2016    Dg Chest 2 View  Result Date: 02/28/2018 CLINICAL DATA:  Cough, congestion, short of breath, low-grade fever for 5 days, smoking history EXAM: CHEST - 2 VIEW COMPARISON:  Chest x-ray of 04/20/2016 and CT chest of 06/03/2009 FINDINGS: In review of the CT, the patient has multiple blebs throughout the lungs many of which are paraseptal in location. The largest bleb is adjacent to the left heart border. Currently this bleb appears to have an air-fluid level which indicates infection. No pneumonia is seen. However, there does appear to be a tiny left pleural effusion blunting the posterior left costophrenic angle. The heart is within normal limits in size. No acute bony abnormality is seen. IMPRESSION: 1. Air-fluid level within large bleb adjacent to the left heart border most consistent with infected bleb. 2. No pneumonia.  Probable small left effusion. Electronically Signed   By: Ivar Drape M.D.   On: 02/28/2018 12:07    Assessment & Plan:   There are no diagnoses linked to this encounter.   No orders of the defined types were placed in this encounter.    Follow-up: No follow-ups on file.  Walker Kehr, MD

## 2018-10-22 NOTE — Assessment & Plan Note (Signed)
Levothroid 

## 2018-10-22 NOTE — Assessment & Plan Note (Signed)
Simvastatin, ASA 

## 2018-10-22 NOTE — Assessment & Plan Note (Signed)
mom died w/CHF discussed

## 2018-10-22 NOTE — Assessment & Plan Note (Addendum)
On Simvastatin ordered 2020

## 2018-10-23 ENCOUNTER — Other Ambulatory Visit: Payer: Self-pay | Admitting: Internal Medicine

## 2018-10-23 MED ORDER — LEVOTHYROXINE SODIUM 150 MCG PO TABS: 150.0000 ug | ORAL_TABLET | Freq: Every day | ORAL | 3 refills | Status: DC

## 2018-11-07 ENCOUNTER — Encounter: Payer: Self-pay | Admitting: Internal Medicine

## 2018-11-20 ENCOUNTER — Other Ambulatory Visit: Payer: Self-pay | Admitting: *Deleted

## 2018-11-26 MED ORDER — ARMODAFINIL 150 MG PO TABS: 150.0000 mg | ORAL_TABLET | Freq: Every morning | ORAL | 1 refills | Status: DC

## 2018-12-12 ENCOUNTER — Other Ambulatory Visit: Payer: Self-pay | Admitting: Internal Medicine

## 2019-01-23 MED ORDER — ANORO ELLIPTA 62.5-25 MCG/INH IN AEPB: INHALATION_SPRAY | RESPIRATORY_TRACT | 3 refills | Status: DC

## 2019-02-06 ENCOUNTER — Ambulatory Visit (INDEPENDENT_AMBULATORY_CARE_PROVIDER_SITE_OTHER)
Admission: RE | Admit: 2019-02-06 | Discharge: 2019-02-06 | Disposition: A | Discharge status: Home / Self Care | Payer: Federal, State, Local not specified - PPO | Source: Ambulatory Visit | Attending: Internal Medicine | Admitting: Internal Medicine

## 2019-02-06 ENCOUNTER — Other Ambulatory Visit: Payer: Self-pay

## 2019-02-06 DIAGNOSIS — E785 Hyperlipidemia, unspecified: Secondary | ICD-10-CM

## 2019-02-08 ENCOUNTER — Other Ambulatory Visit: Payer: Self-pay | Admitting: Internal Medicine

## 2019-02-08 MED ORDER — ASPIRIN EC 81 MG PO TBEC: 81.0000 mg | DELAYED_RELEASE_TABLET | Freq: Every day | ORAL | 3 refills | Status: AC

## 2019-04-07 DIAGNOSIS — Z03818 Encounter for observation for suspected exposure to other biological agents ruled out: Secondary | ICD-10-CM | POA: Diagnosis not present

## 2019-04-23 ENCOUNTER — Encounter: Payer: Federal, State, Local not specified - PPO | Admitting: Internal Medicine

## 2019-05-09 ENCOUNTER — Other Ambulatory Visit: Payer: Self-pay | Admitting: Internal Medicine

## 2019-05-18 MED ORDER — ARMODAFINIL 150 MG PO TABS: 150.0000 mg | ORAL_TABLET | Freq: Every morning | ORAL | 1 refills | Status: DC

## 2019-06-05 ENCOUNTER — Ambulatory Visit (INDEPENDENT_AMBULATORY_CARE_PROVIDER_SITE_OTHER): Payer: Federal, State, Local not specified - PPO | Admitting: Internal Medicine

## 2019-06-05 ENCOUNTER — Other Ambulatory Visit: Payer: Self-pay

## 2019-06-05 ENCOUNTER — Encounter: Payer: Self-pay | Admitting: Internal Medicine

## 2019-06-05 VITALS — BP 118/76 | HR 107 | Temp 98.0°F | Ht 74.0 in | Wt 219.2 lb

## 2019-06-05 DIAGNOSIS — Z0000 Encounter for general adult medical examination without abnormal findings: Secondary | ICD-10-CM

## 2019-06-05 DIAGNOSIS — E034 Atrophy of thyroid (acquired): Secondary | ICD-10-CM

## 2019-06-05 DIAGNOSIS — Z0001 Encounter for general adult medical examination with abnormal findings: Secondary | ICD-10-CM

## 2019-06-05 DIAGNOSIS — J309 Allergic rhinitis, unspecified: Secondary | ICD-10-CM | POA: Diagnosis not present

## 2019-06-05 DIAGNOSIS — I2510 Atherosclerotic heart disease of native coronary artery without angina pectoris: Secondary | ICD-10-CM | POA: Insufficient documentation

## 2019-06-05 DIAGNOSIS — R7309 Other abnormal glucose: Secondary | ICD-10-CM | POA: Diagnosis not present

## 2019-06-05 MED ORDER — METHYLPREDNISOLONE 4 MG PO TBPK: ORAL_TABLET | ORAL | 0 refills | Status: DC

## 2019-06-05 MED ORDER — IPRATROPIUM BROMIDE 0.06 % NA SOLN: 2.0000 | Freq: Three times a day (TID) | NASAL | 2 refills | Status: DC

## 2019-06-05 NOTE — Assessment & Plan Note (Signed)
Singulair, Claritin or Allegra,  Try Atrovent Medrol pack

## 2019-06-05 NOTE — Assessment & Plan Note (Signed)
2020: Coronary calcium score of 184.

## 2019-06-05 NOTE — Assessment & Plan Note (Signed)
A1c Wt loss 

## 2019-06-05 NOTE — Progress Notes (Signed)
Subjective:  Patient ID: [name] [name], male    DOB: March 11, 1961  Age: 59 y.o. MRN: [id number]  CC: Annual Exam   HPI [name] [name] presents for a well exam [name] [name] is complaining of severe chronic nasal congestion, worse now.  Complaining of runny nose.  Outpatient Medications Prior to Visit  Medication Sig Dispense Refill  . Albuterol Sulfate (PROAIR RESPICLICK) 607 (90 Base) MCG/ACT AEPB Inhale 1-2 puffs into the lungs 4 (four) times daily as needed. 1 each 5  . Armodafinil 150 MG tablet Take 1 tablet (150 mg total) by mouth every morning. 90 tablet 1  . aspirin EC 81 MG tablet Take 1 tablet (81 mg total) by mouth daily. 100 tablet 3  . cholecalciferol (VITAMIN D) 1000 UNITS tablet Take 1,000 Units by mouth daily.      Marland Kitchen levothyroxine (SYNTHROID) 150 MCG tablet Take 1 tablet (150 mcg total) by mouth daily. 90 tablet 3  . montelukast (SINGULAIR) 10 MG tablet TAKE 1 TABLET BY MOUTH EVERY DAY 90 tablet 3  . omeprazole (PRILOSEC) 40 MG capsule TAKE 1 CAPSULE BY MOUTH EVERY DAY 90 capsule 1  . simvastatin (ZOCOR) 40 MG tablet TAKE 1 TABLET BY MOUTH EVERY DAY 90 tablet 1  . umeclidinium-vilanterol (ANORO ELLIPTA) 62.5-25 MCG/INH AEPB INHALE ONE ACTUATION INTO THE LUNGS DAILY 60 each 3   No facility-administered medications prior to visit.    ROS: Review of Systems  Constitutional: Positive for unexpected weight change. Negative for appetite change and fatigue.  HENT: Negative for congestion, nosebleeds, sneezing, sore throat and trouble swallowing.   Eyes: Negative for itching and visual disturbance.  Respiratory: Negative for cough.   Cardiovascular: Negative for chest pain, palpitations and leg swelling.  Gastrointestinal: Negative for abdominal distention, blood in stool, diarrhea and nausea.  Genitourinary: Negative for frequency and hematuria.  Musculoskeletal: Positive for arthralgias. Negative for back pain, gait problem, joint swelling and neck pain.  Skin: Negative for  rash.  Neurological: Negative for dizziness, tremors, speech difficulty and weakness.  Psychiatric/Behavioral: Negative for agitation, dysphoric mood, sleep disturbance and suicidal ideas. The patient is not nervous/anxious.     Objective:  BP 118/76 (BP Location: Left Arm, Patient Position: Sitting, Cuff Size: Large)   Pulse (!) 107   Temp 98 F (36.7 C) (Oral)   Ht 6\' 2"  (1.88 m)   Wt 219 lb 3.2 oz (99.4 kg)   SpO2 99%   BMI 28.14 kg/m   BP Readings from Last 3 Encounters:  06/05/19 118/76  10/22/18 126/74  06/25/18 132/74    Wt Readings from Last 3 Encounters:  06/05/19 219 lb 3.2 oz (99.4 kg)  10/22/18 214 lb (97.1 kg)  06/25/18 210 lb (95.3 kg)    Physical Exam Constitutional:      General: He is not in acute distress.    Appearance: He is well-developed. He is obese.     Comments: NAD  Eyes:     Conjunctiva/sclera: Conjunctivae normal.     Pupils: Pupils are equal, round, and reactive to light.  Neck:     Thyroid: No thyromegaly.     Vascular: No JVD.  Cardiovascular:     Rate and Rhythm: Normal rate and regular rhythm.     Heart sounds: Normal heart sounds. No murmur. No friction rub. No gallop.   Pulmonary:     Effort: Pulmonary effort is normal. No respiratory distress.     Breath sounds: Normal breath sounds. No wheezing or rales.  Chest:  Chest wall: No tenderness.  Abdominal:     General: Bowel sounds are normal. There is no distension.     Palpations: Abdomen is soft. There is no mass.     Tenderness: There is no abdominal tenderness. There is no guarding or rebound.  Musculoskeletal:        General: No tenderness. Normal range of motion.     Cervical back: Normal range of motion.  Lymphadenopathy:     Cervical: No cervical adenopathy.  Skin:    General: Skin is warm and dry.     Findings: No rash.  Neurological:     Mental Status: He is alert and oriented to person, place, and time.     Cranial Nerves: No cranial nerve deficit.     Motor:  No abnormal muscle tone.     Coordination: Coordination normal.     Gait: Gait normal.     Deep Tendon Reflexes: Reflexes are normal and symmetric.  Psychiatric:        Behavior: Behavior normal.        Thought Content: Thought content normal.        Judgment: Judgment normal.   Wax in the left ear canal Swollen nasal passages  Lab Results  Component Value Date   WBC 7.1 01/29/2018   HGB 14.4 01/29/2018   HCT 42.3 01/29/2018   PLT 225.0 01/29/2018   GLUCOSE 83 10/22/2018   CHOL 183 01/29/2018   TRIG 134.0 01/29/2018   HDL 45.80 01/29/2018   LDLDIRECT 135.6 01/25/2012   LDLCALC 110 (H) 01/29/2018   ALT 20 01/29/2018   AST 17 01/29/2018   NA 139 10/22/2018   K 3.9 10/22/2018   CL 104 10/22/2018   CREATININE 1.00 10/22/2018   BUN 11 10/22/2018   CO2 28 10/22/2018   TSH 8.61 (H) 10/22/2018   PSA 0.42 01/29/2018   INR 0.9 RATIO 01/28/2007   HGBA1C 6.4 07/27/2016    CT CARDIAC SCORING  Addendum Date: 02/07/2019   ADDENDUM REPORT: 02/07/2019 07:50 EXAM: OVER-READ INTERPRETATION  CT CHEST The following report is an over-read performed by radiologist Dr. Maryelizabeth Rowan [facility] on 02/07/2019. This over-read does not include interpretation of cardiac or coronary anatomy or pathology. The calcium score interpretation by the cardiologist is attached. COMPARISON:  None. FINDINGS: Limited view of the lung parenchyma demonstrates paraseptal emphysema in the LEFT lung. Airways are normal. Limited view of the mediastinum demonstrates no adenopathy. Esophagus normal. Limited view of the upper abdomen unremarkable. Limited view of the skeleton and chest wall is unremarkable. IMPRESSION: 1. Paraseptal emphysema. 2. No acute findings. Electronically Signed   By: Genevive Bi M.D.   On: 02/07/2019 07:50   Result Date: 02/07/2019 CLINICAL DATA:  Risk stratification EXAM: Coronary Calcium Score MEDICATIONS: None TECHNIQUE: The patient was scanned on a [employer].  Axial non-contrast 3 mm slices were carried out through the heart. The data set was analyzed on a dedicated work station and scored using the Agatson method. FINDINGS: Non-cardiac: See separate report from [facility]. Ascending Aorta: Normal size, trivial calcifications. Pericardium: Normal. Coronary arteries: Normal size. IMPRESSION: Coronary calcium score of 184. This was 72 percentile for age and sex matched control. Electronically Signed: By: Tobias Alexander On: 02/06/2019 19:00    Assessment & Plan:   Sonda Primes, MD

## 2019-06-05 NOTE — Assessment & Plan Note (Signed)
We discussed age appropriate health related issues, including available/recomended screening tests and vaccinations. We discussed a need for adhering to healthy diet and exercise. Labs ordered. All questions were answered. 

## 2019-06-08 NOTE — Assessment & Plan Note (Signed)
Continue with Levothroid 

## 2019-06-11 ENCOUNTER — Other Ambulatory Visit (INDEPENDENT_AMBULATORY_CARE_PROVIDER_SITE_OTHER): Payer: Federal, State, Local not specified - PPO

## 2019-06-11 ENCOUNTER — Other Ambulatory Visit: Payer: Self-pay

## 2019-06-11 DIAGNOSIS — R7309 Other abnormal glucose: Secondary | ICD-10-CM | POA: Diagnosis not present

## 2019-06-11 DIAGNOSIS — Z125 Encounter for screening for malignant neoplasm of prostate: Secondary | ICD-10-CM

## 2019-06-11 DIAGNOSIS — Z0000 Encounter for general adult medical examination without abnormal findings: Secondary | ICD-10-CM

## 2019-06-11 LAB — BASIC METABOLIC PANEL
BUN: 14 mg/dL (ref 6–23)
CO2: 31 mEq/L (ref 19–32)
Calcium: 9.4 mg/dL (ref 8.4–10.5)
Chloride: 101 mEq/L (ref 96–112)
Creatinine, Ser: 1.05 mg/dL (ref 0.40–1.50)
GFR: 72.79 mL/min (ref >60.00)
Glucose, Bld: 111 mg/dL — ABNORMAL HIGH (ref 70–99)
Potassium: 4.1 mEq/L (ref 3.5–5.1)
Sodium: 138 mEq/L (ref 135–145)

## 2019-06-11 LAB — URINALYSIS
Bilirubin Urine: NEGATIVE
Ketones, ur: NEGATIVE
Leukocytes,Ua: NEGATIVE
Nitrite: NEGATIVE
Specific Gravity, Urine: 1.025 (ref 1.000–1.030)
Total Protein, Urine: NEGATIVE
Urine Glucose: NEGATIVE
Urobilinogen, UA: 0.2 (ref 0.0–1.0)
pH: 6 (ref 5.0–8.0)

## 2019-06-11 LAB — HEPATIC FUNCTION PANEL
ALT: 20 U/L (ref 0–53)
AST: 13 U/L (ref 0–37)
Albumin: 4.1 g/dL (ref 3.5–5.2)
Alkaline Phosphatase: 73 U/L (ref 39–117)
Bilirubin, Direct: 0.1 mg/dL (ref 0.0–0.3)
Total Bilirubin: 0.3 mg/dL (ref 0.2–1.2)
Total Protein: 6.5 g/dL (ref 6.0–8.3)

## 2019-06-11 LAB — CBC WITH DIFFERENTIAL/PLATELET
Basophils Absolute: 0.1 10*3/uL (ref 0.0–0.1)
Basophils Relative: 0.8 % (ref 0.0–3.0)
Eosinophils Absolute: 0.2 10*3/uL (ref 0.0–0.7)
Eosinophils Relative: 2.2 % (ref 0.0–5.0)
HCT: 44.2 % (ref 39.0–52.0)
Hemoglobin: 14.7 g/dL (ref 13.0–17.0)
Lymphocytes Relative: 27.7 % (ref 12.0–46.0)
Lymphs Abs: 2.4 10*3/uL (ref 0.7–4.0)
MCHC: 33.4 g/dL (ref 30.0–36.0)
MCV: 92.2 fl (ref 78.0–100.0)
Monocytes Absolute: 0.9 10*3/uL (ref 0.1–1.0)
Monocytes Relative: 10 % (ref 3.0–12.0)
Neutro Abs: 5.2 10*3/uL (ref 1.4–7.7)
Neutrophils Relative %: 59.3 % (ref 43.0–77.0)
Platelets: 242 10*3/uL (ref 150.0–400.0)
RBC: 4.79 Mil/uL (ref 4.22–5.81)
RDW: 13.8 % (ref 11.5–15.5)
WBC: 8.7 10*3/uL (ref 4.0–10.5)

## 2019-06-11 LAB — LIPID PANEL
Cholesterol: 196 mg/dL (ref 0–200)
HDL: 47 mg/dL (ref >39.00)
LDL Cholesterol: 123 mg/dL — ABNORMAL HIGH (ref 0–99)
NonHDL: 148.61
Total CHOL/HDL Ratio: 4
Triglycerides: 127 mg/dL (ref 0.0–149.0)
VLDL: 25.4 mg/dL (ref 0.0–40.0)

## 2019-06-11 LAB — PSA: PSA: 0.35 ng/mL (ref 0.10–4.00)

## 2019-06-11 LAB — TSH: TSH: 3.92 u[IU]/mL (ref 0.35–4.50)

## 2019-06-11 LAB — HEMOGLOBIN A1C: Hgb A1c MFr Bld: 6.6 % — ABNORMAL HIGH (ref 4.6–6.5)

## 2019-07-15 NOTE — Telephone Encounter (Signed)
New Message:   [facility] is calling to check on the status of prior authorization of this patient's medication. Please advise.

## 2019-07-22 NOTE — Telephone Encounter (Signed)
approved for Armodafinil 150MG  OR TABS. The authorization is valid from 06/22/2019 through 07/21/2020

## 2019-10-11 ENCOUNTER — Other Ambulatory Visit: Payer: Self-pay | Admitting: Internal Medicine

## 2019-11-16 ENCOUNTER — Other Ambulatory Visit: Payer: Self-pay | Admitting: Internal Medicine

## 2019-11-24 ENCOUNTER — Other Ambulatory Visit: Payer: Self-pay | Admitting: Internal Medicine

## 2019-11-24 ENCOUNTER — Other Ambulatory Visit: Payer: Self-pay

## 2019-11-25 MED ORDER — ARMODAFINIL 150 MG PO TABS: 150.0000 mg | ORAL_TABLET | Freq: Every morning | ORAL | 1 refills | Status: DC

## 2019-12-04 ENCOUNTER — Encounter: Payer: Self-pay | Admitting: Internal Medicine

## 2019-12-04 ENCOUNTER — Other Ambulatory Visit: Payer: Self-pay

## 2019-12-04 ENCOUNTER — Ambulatory Visit: Payer: Federal, State, Local not specified - PPO | Admitting: Internal Medicine

## 2019-12-04 VITALS — BP 114/76 | HR 106 | Temp 98.5°F | Ht 74.0 in | Wt 217.0 lb

## 2019-12-04 DIAGNOSIS — J439 Emphysema, unspecified: Secondary | ICD-10-CM

## 2019-12-04 DIAGNOSIS — J441 Chronic obstructive pulmonary disease with (acute) exacerbation: Secondary | ICD-10-CM | POA: Diagnosis not present

## 2019-12-04 DIAGNOSIS — Z0000 Encounter for general adult medical examination without abnormal findings: Secondary | ICD-10-CM

## 2019-12-04 DIAGNOSIS — E034 Atrophy of thyroid (acquired): Secondary | ICD-10-CM

## 2019-12-04 DIAGNOSIS — I2510 Atherosclerotic heart disease of native coronary artery without angina pectoris: Secondary | ICD-10-CM

## 2019-12-04 DIAGNOSIS — I2583 Coronary atherosclerosis due to lipid rich plaque: Secondary | ICD-10-CM

## 2019-12-04 DIAGNOSIS — Z23 Encounter for immunization: Secondary | ICD-10-CM

## 2019-12-04 DIAGNOSIS — R739 Hyperglycemia, unspecified: Secondary | ICD-10-CM

## 2019-12-04 MED ORDER — BREZTRI AEROSPHERE 160-9-4.8 MCG/ACT IN AERO: 2.0000 | INHALATION_SPRAY | Freq: Two times a day (BID) | RESPIRATORY_TRACT | 11 refills | Status: DC

## 2019-12-04 MED ORDER — FLUTICASONE-SALMETEROL 250-50 MCG/DOSE IN AEPB
1.0000 | INHALATION_SPRAY | Freq: Two times a day (BID) | RESPIRATORY_TRACT | 3 refills | Status: DC
Start: 2019-12-04 — End: 2020-06-07

## 2019-12-04 NOTE — Patient Instructions (Signed)
Budesonide; Glycopyrrolate; Formoterol inhalation aerosol What is this medicine? BUDESONIDE; GLYCOPYRROLATE; FORMOTEROL (byoo DES oh nide; glye koe PYE roe late; for [facility] te rol) inhalation is a combination of 3 medicines. Budesonide decreases inflammation in the lungs. Formoterol and glycopyrrolate are bronchodilators that help keep airways open. This medicine is used to treat chronic obstructive pulmonary disease (COPD). Do not use this drug combination for acute COPD attacks or bronchospasm. This medicine may be used for other purposes; ask your health care provider or pharmacist if you have questions. COMMON BRAND NAME(S): BREZTRI What should I tell my health care provider before I take this medicine? They need to know if you have any of these conditions:  bladder problems or trouble passing urine  bone problems  diabetes  eye disease, vision problems  heart disease  high blood pressure  history of irregular heartbeat  immune system problems  infection  kidney disease  pheochromocytoma  prostate disease  seizures  thyroid disease  an unusual or allergic reaction to budesonide, formoterol, glycopyrrolate, medicines, foods, dyes, or preservatives  pregnant or trying to get pregnant  breast-feeding How should I use this medicine? This medicine is inhaled through the mouth. Follow the directions on the prescription label. Shake well before each use. Rinse your mouth with water after use. Make sure not to swallow the water. Take your medicine at regular intervals. Do not take your medicine more often than directed. Do not stop taking except on your doctor's advice. Make sure that you are using your inhaler correctly. This drug comes with INSTRUCTIONS FOR USE. Ask your pharmacist for directions on how to use this drug. Read the information carefully. Talk to your pharmacist or health care provider if you have questions. Talk to your pediatrician regarding the use of this  medicine in children. This medicine is not approved for use in children. Overdosage: If you think you have taken too much of this medicine contact a poison control center or emergency room at once. NOTE: This medicine is only for you. Do not share this medicine with others. What if I miss a dose? If you miss a dose, use it as soon as you can. If it is almost time for your next dose, use only that dose. Do not use double or extra doses. What may interact with this medicine? Do not take the medicine with any of the following medications:  cisapride  dofetilide  dronedarone  MAOIs like Marplan, Nardil, and Parnate  other medicines that contain long-acting beta-2 agonists (LABAs) like arformoterol, formoterol, indacaterol, olodaterol, salmeterol, vilanterol  pimozide  procarbazine  thioridazine This medicine may also interact with the following medications:  antihistamines for allergy, cough, and cold  atropine  certain antibiotics like clarithromycin, telithromycin  certain antivirals for HIV or hepatitis  certain heart medicines like atenolol, metoprolol  certain medicines for bladder problems like oxybutynin, tolterodine  certain medicines for blood pressure, heart disease, irregular heartbeat  certain medicines for depression, anxiety, or psychotic disturbances  certain medicines for fungal infections like ketoconazole, itraconazole  certain medicines for Parkinson's disease like benztropine, trihexyphenidyl  certain medicines for stomach problems like dicyclomine, hyoscyamine  certain medicines for travel sickness like scopolamine  diuretics  grapefruit juice  mifepristone  other inhaled medicines that contain anticholinergics such as aclidinium, ipratropium, tiotropium, umeclidinium  other medicines that prolong the QT interval (an abnormal heart rhythm)  some vaccines  steroid medicines like prednisone or cortisone  stimulant medicines for attention  disorders, weight loss, or to stay awake  theophylline This list may not describe all possible interactions. Give your health care provider a list of all the medicines, herbs, non-prescription drugs, or dietary supplements you use. Also tell them if you smoke, drink alcohol, or use illegal drugs. Some items may interact with your medicine. What should I watch for while using this medicine? Visit your health care professional for regular checks on your progress. Tell your health care professional if your symptoms do not start to get better or if they get worse. NEVER use this medicine for an acute asthma or COPD attack. You should use your short-acting rescue inhalers for this purpose. If your symptoms get worse or if you need your short-acting inhalers more often, call your doctor right away. This medicine may increase your risk of getting an infection. Tell your doctor or health care professional if you are around anyone with measles or chickenpox, or if you develop sores or blisters that do not heal properly. Do not get this medicine in your eyes. It can cause irritation, pain, or blurred vision. What side effects may I notice from receiving this medicine? Side effects that you should report to your doctor or health care professional as soon as possible:  allergic reactions like skin rash, itching or hives, swelling of the face, lips, or tongue  anxious  breathing problems  changes in vision, eye pain  muscle cramps or muscle pain  signs and symptoms of a dangerous change in heartbeat or heart rhythm like chest pain; dizziness; fast or irregular heartbeat; palpitations; feeling faint or lightheaded, falls; breathing problems  signs and symptoms of high blood sugar such as being more thirsty or hungry or having to urinate more than normal. You may also feel very tired or have blurry vision  signs and symptoms of infection like fever; chills; cough; sore throat; pain or trouble passing  urine  tremors  trouble passing urine or change in the amount of urine  unusually weak or tired  white patches in the mouth or mouth sores Side effects that usually do not require medical attention (report these to your doctor or health care professional if they continue or are bothersome):  back pain  changes in taste  cough  diarrhea  runny or stuffy nose  upset stomach This list may not describe all possible side effects. Call your doctor for medical advice about side effects. You may report side effects to FDA at 1-800-FDA-1088. Where should I keep my medicine? Keep out of the reach of children. Store in a dry place at room temperature between 15 and 30 degrees C (59 and 86 degrees F). Protect from heat. Do not freeze. Do not use or store near heat or flame, as the canister may burst. Throw away the inhaler 3 months after you open the foil pouch (for the 120-inhalation canister), or 3 weeks after you open the foil pouch (for the 28-inhalation canister), or when the dose indicator reaches zero "0", whichever comes first. NOTE: This sheet is a summary. It may not cover all possible information. If you have questions about this medicine, talk to your doctor, pharmacist, or health care provider.  2020 Elsevier/Gold Standard (2018-11-26 18:58:00)

## 2019-12-04 NOTE — Addendum Note (Signed)
Addended by: Marshell Garfinkel on: 12/04/2019 03:22 PM   Modules accepted: Orders

## 2019-12-04 NOTE — Assessment & Plan Note (Signed)
Levothroid 

## 2019-12-04 NOTE — Assessment & Plan Note (Addendum)
Breztri or Advair if less expensive than Breo Pneumovax Vaccinated for COVID

## 2019-12-04 NOTE — Progress Notes (Signed)
Subjective:  Patient ID: [name] [name], male    DOB: 1960/05/03  Age: 59 y.o. MRN: [id number]  CC: No chief complaint on file.   HPI [name] [name] presents for asthma, dyslipidemia, hypothyroidism f/u  Outpatient Medications Prior to Visit  Medication Sig Dispense Refill  . Albuterol Sulfate (PROAIR RESPICLICK) 108 (90 Base) MCG/ACT AEPB Inhale 1-2 puffs into the lungs 4 (four) times daily as needed. 1 each 5  . Armodafinil 150 MG tablet Take 1 tablet (150 mg total) by mouth every morning. 90 tablet 1  . cholecalciferol (VITAMIN D) 1000 UNITS tablet Take 1,000 Units by mouth daily.      Marland Kitchen levothyroxine (SYNTHROID) 150 MCG tablet TAKE 1 TABLET BY MOUTH EVERY DAY 90 tablet 3  . montelukast (SINGULAIR) 10 MG tablet TAKE 1 TABLET BY MOUTH EVERY DAY 90 tablet 3  . omeprazole (PRILOSEC) 40 MG capsule TAKE 1 CAPSULE BY MOUTH EVERY DAY 90 capsule 3  . simvastatin (ZOCOR) 40 MG tablet TAKE 1 TABLET BY MOUTH EVERY DAY 90 tablet 1  . umeclidinium-vilanterol (ANORO ELLIPTA) 62.5-25 MCG/INH AEPB INHALE ONE ACTUATION INTO THE LUNGS DAILY 60 each 3  . aspirin EC 81 MG tablet Take 1 tablet (81 mg total) by mouth daily. (Patient not taking: Reported on 12/04/2019) 100 tablet 3  . ipratropium (ATROVENT) 0.06 % nasal spray Place 2 sprays into the nose 3 (three) times daily. 15 mL 2  . methylPREDNISolone (MEDROL DOSEPAK) 4 MG TBPK tablet As directed 21 tablet 0   No facility-administered medications prior to visit.    ROS: Review of Systems  Constitutional: Negative for appetite change, fatigue and unexpected weight change.  HENT: Negative for congestion, nosebleeds, sneezing, sore throat and trouble swallowing.   Eyes: Negative for itching and visual disturbance.  Respiratory: Positive for shortness of breath and wheezing. Negative for cough.   Cardiovascular: Negative for chest pain, palpitations and leg swelling.  Gastrointestinal: Negative for abdominal distention, blood in stool, diarrhea and  nausea.  Genitourinary: Negative for frequency and hematuria.  Musculoskeletal: Negative for back pain, gait problem, joint swelling and neck pain.  Skin: Negative for rash.  Neurological: Negative for dizziness, tremors, speech difficulty and weakness.  Psychiatric/Behavioral: Negative for agitation, dysphoric mood, sleep disturbance and suicidal ideas. The patient is not nervous/anxious.     Objective:  BP 114/76 (BP Location: Right Arm, Patient Position: Sitting, Cuff Size: Large)   Pulse (!) 106   Temp 98.5 F (36.9 C) (Oral)   Ht 6\' 2"  (1.88 m)   Wt 217 lb (98.4 kg)   SpO2 96%   BMI 27.86 kg/m   BP Readings from Last 3 Encounters:  12/04/19 114/76  06/05/19 118/76  10/22/18 126/74    Wt Readings from Last 3 Encounters:  12/04/19 217 lb (98.4 kg)  06/05/19 219 lb 3.2 oz (99.4 kg)  10/22/18 214 lb (97.1 kg)    Physical Exam Constitutional:      General: He is not in acute distress.    Appearance: He is well-developed.     Comments: NAD  Eyes:     Conjunctiva/sclera: Conjunctivae normal.     Pupils: Pupils are equal, round, and reactive to light.  Neck:     Thyroid: No thyromegaly.     Vascular: No JVD.  Cardiovascular:     Rate and Rhythm: Normal rate and regular rhythm.     Heart sounds: Normal heart sounds. No murmur heard.  No friction rub. No gallop.   Pulmonary:  Effort: Pulmonary effort is normal. No respiratory distress.     Breath sounds: Normal breath sounds. No wheezing or rales.  Chest:     Chest wall: No tenderness.  Abdominal:     General: Bowel sounds are normal. There is no distension.     Palpations: Abdomen is soft. There is no mass.     Tenderness: There is no abdominal tenderness. There is no guarding or rebound.  Musculoskeletal:        General: No tenderness. Normal range of motion.     Cervical back: Normal range of motion.  Lymphadenopathy:     Cervical: No cervical adenopathy.  Skin:    General: Skin is warm and dry.      Findings: No rash.  Neurological:     Mental Status: He is alert and oriented to person, place, and time.     Cranial Nerves: No cranial nerve deficit.     Motor: No abnormal muscle tone.     Coordination: Coordination normal.     Gait: Gait normal.     Deep Tendon Reflexes: Reflexes are normal and symmetric.  Psychiatric:        Behavior: Behavior normal.        Thought Content: Thought content normal.        Judgment: Judgment normal.     Lab Results  Component Value Date   WBC 8.7 06/11/2019   HGB 14.7 06/11/2019   HCT 44.2 06/11/2019   PLT 242.0 06/11/2019   GLUCOSE 111 (H) 06/11/2019   CHOL 196 06/11/2019   TRIG 127.0 06/11/2019   HDL 47.00 06/11/2019   LDLDIRECT 135.6 01/25/2012   LDLCALC 123 (H) 06/11/2019   ALT 20 06/11/2019   AST 13 06/11/2019   NA 138 06/11/2019   K 4.1 06/11/2019   CL 101 06/11/2019   CREATININE 1.05 06/11/2019   BUN 14 06/11/2019   CO2 31 06/11/2019   TSH 3.92 06/11/2019   PSA 0.35 06/11/2019   INR 0.9 RATIO 01/28/2007   HGBA1C 6.6 (H) 06/11/2019    CT CARDIAC SCORING  Addendum Date: 02/07/2019   ADDENDUM REPORT: 02/07/2019 07:50 EXAM: OVER-READ INTERPRETATION  CT CHEST The following report is an over-read performed by radiologist Dr. Maryelizabeth Rowan [facility] on 02/07/2019. This over-read does not include interpretation of cardiac or coronary anatomy or pathology. The calcium score interpretation by the cardiologist is attached. COMPARISON:  None. FINDINGS: Limited view of the lung parenchyma demonstrates paraseptal emphysema in the LEFT lung. Airways are normal. Limited view of the mediastinum demonstrates no adenopathy. Esophagus normal. Limited view of the upper abdomen unremarkable. Limited view of the skeleton and chest wall is unremarkable. IMPRESSION: 1. Paraseptal emphysema. 2. No acute findings. Electronically Signed   By: Genevive Bi M.D.   On: 02/07/2019 07:50   Result Date: 02/07/2019 CLINICAL DATA:  Risk  stratification EXAM: Coronary Calcium Score MEDICATIONS: None TECHNIQUE: The patient was scanned on a [employer]. Axial non-contrast 3 mm slices were carried out through the heart. The data set was analyzed on a dedicated work station and scored using the Agatson method. FINDINGS: Non-cardiac: See separate report from [facility]. Ascending Aorta: Normal size, trivial calcifications. Pericardium: Normal. Coronary arteries: Normal size. IMPRESSION: Coronary calcium score of 184. This was 38 percentile for age and sex matched control. Electronically Signed: By: Tobias Alexander On: 02/06/2019 19:00    Assessment & Plan:   There are no diagnoses linked to this encounter.   No orders of  the defined types were placed in this encounter.    Follow-up: No follow-ups on file.  Walker Kehr, MD

## 2019-12-04 NOTE — Assessment & Plan Note (Signed)
Simvastatin 

## 2019-12-04 NOTE — Assessment & Plan Note (Signed)
Breztri or Advair if less expensive than [employer]

## 2019-12-08 DIAGNOSIS — L821 Other seborrheic keratosis: Secondary | ICD-10-CM | POA: Diagnosis not present

## 2019-12-08 DIAGNOSIS — D1801 Hemangioma of skin and subcutaneous tissue: Secondary | ICD-10-CM | POA: Diagnosis not present

## 2019-12-08 DIAGNOSIS — D045 Carcinoma in situ of skin of trunk: Secondary | ICD-10-CM | POA: Diagnosis not present

## 2019-12-08 DIAGNOSIS — D225 Melanocytic nevi of trunk: Secondary | ICD-10-CM | POA: Diagnosis not present

## 2019-12-08 DIAGNOSIS — D3611 Benign neoplasm of peripheral nerves and autonomic nervous system of face, head, and neck: Secondary | ICD-10-CM | POA: Diagnosis not present

## 2019-12-08 DIAGNOSIS — L812 Freckles: Secondary | ICD-10-CM | POA: Diagnosis not present

## 2019-12-08 DIAGNOSIS — D234 Other benign neoplasm of skin of scalp and neck: Secondary | ICD-10-CM | POA: Diagnosis not present

## 2020-02-19 DIAGNOSIS — Z9989 Dependence on other enabling machines and devices: Secondary | ICD-10-CM | POA: Diagnosis not present

## 2020-02-19 DIAGNOSIS — G4733 Obstructive sleep apnea (adult) (pediatric): Secondary | ICD-10-CM | POA: Diagnosis not present

## 2020-02-26 ENCOUNTER — Ambulatory Visit: Payer: Self-pay | Admitting: Psychology

## 2020-03-18 ENCOUNTER — Ambulatory Visit: Payer: Self-pay | Admitting: Psychology

## 2020-04-05 DIAGNOSIS — L812 Freckles: Secondary | ICD-10-CM | POA: Diagnosis not present

## 2020-04-05 DIAGNOSIS — L570 Actinic keratosis: Secondary | ICD-10-CM | POA: Diagnosis not present

## 2020-04-05 DIAGNOSIS — D1801 Hemangioma of skin and subcutaneous tissue: Secondary | ICD-10-CM | POA: Diagnosis not present

## 2020-04-05 DIAGNOSIS — L821 Other seborrheic keratosis: Secondary | ICD-10-CM | POA: Diagnosis not present

## 2020-04-05 DIAGNOSIS — Z85828 Personal history of other malignant neoplasm of skin: Secondary | ICD-10-CM | POA: Diagnosis not present

## 2020-05-18 ENCOUNTER — Other Ambulatory Visit: Payer: Self-pay | Admitting: Internal Medicine

## 2020-05-29 ENCOUNTER — Other Ambulatory Visit: Payer: Self-pay | Admitting: Internal Medicine

## 2020-06-04 ENCOUNTER — Other Ambulatory Visit: Payer: Self-pay

## 2020-06-04 ENCOUNTER — Other Ambulatory Visit (INDEPENDENT_AMBULATORY_CARE_PROVIDER_SITE_OTHER): Payer: Federal, State, Local not specified - PPO

## 2020-06-04 DIAGNOSIS — E034 Atrophy of thyroid (acquired): Secondary | ICD-10-CM

## 2020-06-04 DIAGNOSIS — R739 Hyperglycemia, unspecified: Secondary | ICD-10-CM

## 2020-06-04 DIAGNOSIS — Z0000 Encounter for general adult medical examination without abnormal findings: Secondary | ICD-10-CM | POA: Diagnosis not present

## 2020-06-04 LAB — CBC WITH DIFFERENTIAL/PLATELET
Basophils Absolute: 0.1 10*3/uL (ref 0.0–0.1)
Basophils Relative: 1.1 % (ref 0.0–3.0)
Eosinophils Absolute: 0.2 10*3/uL (ref 0.0–0.7)
Eosinophils Relative: 2.4 % (ref 0.0–5.0)
HCT: 43.5 % (ref 39.0–52.0)
Hemoglobin: 14.8 g/dL (ref 13.0–17.0)
Lymphocytes Relative: 25.2 % (ref 12.0–46.0)
Lymphs Abs: 1.8 10*3/uL (ref 0.7–4.0)
MCHC: 33.9 g/dL (ref 30.0–36.0)
MCV: 90.5 fl (ref 78.0–100.0)
Monocytes Absolute: 0.8 10*3/uL (ref 0.1–1.0)
Monocytes Relative: 11.5 % (ref 3.0–12.0)
Neutro Abs: 4.3 10*3/uL (ref 1.4–7.7)
Neutrophils Relative %: 59.8 % (ref 43.0–77.0)
Platelets: 233 10*3/uL (ref 150.0–400.0)
RBC: 4.81 Mil/uL (ref 4.22–5.81)
RDW: 13.7 % (ref 11.5–15.5)
WBC: 7.2 10*3/uL (ref 4.0–10.5)

## 2020-06-04 LAB — LIPID PANEL
Cholesterol: 203 mg/dL — ABNORMAL HIGH (ref 0–200)
HDL: 40.9 mg/dL (ref >39.00)
LDL Cholesterol: 127 mg/dL — ABNORMAL HIGH (ref 0–99)
NonHDL: 162.49
Total CHOL/HDL Ratio: 5
Triglycerides: 178 mg/dL — ABNORMAL HIGH (ref 0.0–149.0)
VLDL: 35.6 mg/dL (ref 0.0–40.0)

## 2020-06-04 LAB — COMPREHENSIVE METABOLIC PANEL
ALT: 23 U/L (ref 0–53)
AST: 16 U/L (ref 0–37)
Albumin: 4.2 g/dL (ref 3.5–5.2)
Alkaline Phosphatase: 71 U/L (ref 39–117)
BUN: 13 mg/dL (ref 6–23)
CO2: 30 mEq/L (ref 19–32)
Calcium: 9.5 mg/dL (ref 8.4–10.5)
Chloride: 102 mEq/L (ref 96–112)
Creatinine, Ser: 0.94 mg/dL (ref 0.40–1.50)
GFR: 89.66 mL/min (ref >60.00)
Glucose, Bld: 117 mg/dL — ABNORMAL HIGH (ref 70–99)
Potassium: 4.3 mEq/L (ref 3.5–5.1)
Sodium: 137 mEq/L (ref 135–145)
Total Bilirubin: 0.4 mg/dL (ref 0.2–1.2)
Total Protein: 6.6 g/dL (ref 6.0–8.3)

## 2020-06-04 LAB — URINALYSIS
Bilirubin Urine: NEGATIVE
Ketones, ur: NEGATIVE
Leukocytes,Ua: NEGATIVE
Nitrite: NEGATIVE
Specific Gravity, Urine: 1.01 (ref 1.000–1.030)
Total Protein, Urine: NEGATIVE
Urine Glucose: NEGATIVE
Urobilinogen, UA: 0.2 (ref 0.0–1.0)
pH: 7 (ref 5.0–8.0)

## 2020-06-04 LAB — PSA: PSA: 0.67 ng/mL (ref 0.10–4.00)

## 2020-06-04 LAB — HEMOGLOBIN A1C: Hgb A1c MFr Bld: 6.7 % — ABNORMAL HIGH (ref 4.6–6.5)

## 2020-06-04 LAB — T4, FREE: Free T4: 1.04 ng/dL (ref 0.60–1.60)

## 2020-06-04 LAB — TSH: TSH: 1.52 u[IU]/mL (ref 0.35–4.50)

## 2020-06-04 NOTE — Addendum Note (Signed)
Addended by: Ebony Cargo on: 06/04/2020 10:02 AM   Modules accepted: Orders

## 2020-06-07 ENCOUNTER — Ambulatory Visit: Payer: Federal, State, Local not specified - PPO | Admitting: Internal Medicine

## 2020-06-07 ENCOUNTER — Encounter: Payer: Self-pay | Admitting: Internal Medicine

## 2020-06-07 ENCOUNTER — Other Ambulatory Visit: Payer: Self-pay

## 2020-06-07 VITALS — BP 120/82 | HR 88 | Temp 98.6°F | Ht 74.0 in | Wt 225.0 lb

## 2020-06-07 DIAGNOSIS — J439 Emphysema, unspecified: Secondary | ICD-10-CM

## 2020-06-07 DIAGNOSIS — E1169 Type 2 diabetes mellitus with other specified complication: Secondary | ICD-10-CM | POA: Insufficient documentation

## 2020-06-07 DIAGNOSIS — Z720 Tobacco use: Secondary | ICD-10-CM

## 2020-06-07 DIAGNOSIS — I2583 Coronary atherosclerosis due to lipid rich plaque: Secondary | ICD-10-CM

## 2020-06-07 DIAGNOSIS — E669 Obesity, unspecified: Secondary | ICD-10-CM

## 2020-06-07 DIAGNOSIS — E785 Hyperlipidemia, unspecified: Secondary | ICD-10-CM

## 2020-06-07 DIAGNOSIS — I2510 Atherosclerotic heart disease of native coronary artery without angina pectoris: Secondary | ICD-10-CM | POA: Diagnosis not present

## 2020-06-07 DIAGNOSIS — Z0000 Encounter for general adult medical examination without abnormal findings: Secondary | ICD-10-CM

## 2020-06-07 MED ORDER — METFORMIN HCL 500 MG PO TABS: 500.0000 mg | ORAL_TABLET | Freq: Every day | ORAL | 3 refills | Status: DC

## 2020-06-07 NOTE — Progress Notes (Signed)
Subjective:  Patient ID: [name] [name], male    DOB: October 25, 1960  Age: 60 y.o. MRN: [id number]  CC: Annual Exam   HPI [name] [name] presents for a well exam  Outpatient Medications Prior to Visit  Medication Sig Dispense Refill  . Albuterol Sulfate (PROAIR RESPICLICK) 108 (90 Base) MCG/ACT AEPB Inhale 1-2 puffs into the lungs 4 (four) times daily as needed. 1 each 5  . Armodafinil 150 MG tablet TAKE 1 TABLET BY MOUTH EVERY DAY IN THE MORNING 30 tablet 5  . Budeson-Glycopyrrol-Formoterol (BREZTRI AEROSPHERE) 160-9-4.8 MCG/ACT AERO Inhale 2 puffs into the lungs in the morning and at bedtime. 10 g 11  . cholecalciferol (VITAMIN D) 1000 UNITS tablet Take 1,000 Units by mouth daily.    Marland Kitchen levothyroxine (SYNTHROID) 150 MCG tablet TAKE 1 TABLET BY MOUTH EVERY DAY 90 tablet 3  . montelukast (SINGULAIR) 10 MG tablet TAKE 1 TABLET BY MOUTH EVERY DAY 90 tablet 3  . omeprazole (PRILOSEC) 40 MG capsule TAKE 1 CAPSULE BY MOUTH EVERY DAY 90 capsule 3  . simvastatin (ZOCOR) 40 MG tablet TAKE 1 TABLET BY MOUTH EVERY DAY 90 tablet 1  . Fluticasone-Salmeterol (ADVAIR DISKUS) 250-50 MCG/DOSE AEPB Inhale 1 puff into the lungs 2 (two) times daily. 3 each 3  . ipratropium (ATROVENT) 0.06 % nasal spray Place 2 sprays into the nose 3 (three) times daily. 15 mL 2  . methylPREDNISolone (MEDROL DOSEPAK) 4 MG TBPK tablet As directed 21 tablet 0  . umeclidinium-vilanterol (ANORO ELLIPTA) 62.5-25 MCG/INH AEPB INHALE ONE ACTUATION INTO THE LUNGS DAILY 60 each 3   No facility-administered medications prior to visit.    ROS: Review of Systems  Constitutional: Positive for unexpected weight change. Negative for appetite change and fatigue.  HENT: Negative for congestion, nosebleeds, sneezing, sore throat and trouble swallowing.   Eyes: Negative for itching and visual disturbance.  Respiratory: Negative for cough.   Cardiovascular: Negative for chest pain, palpitations and leg swelling.  Gastrointestinal:  Negative for abdominal distention, blood in stool, diarrhea and nausea.  Genitourinary: Negative for frequency and hematuria.  Musculoskeletal: Negative for back pain, gait problem, joint swelling and neck pain.  Skin: Negative for rash.  Neurological: Negative for dizziness, tremors, speech difficulty and weakness.  Psychiatric/Behavioral: Negative for agitation, dysphoric mood, sleep disturbance and suicidal ideas. The patient is not nervous/anxious.     Objective:  BP 120/82 (BP Location: Left Arm)   Pulse 88   Temp 98.6 F (37 C) (Oral)   Ht 6\' 2"  (1.88 m)   Wt 225 lb (102.1 kg)   SpO2 96%   BMI 28.89 kg/m   BP Readings from Last 3 Encounters:  06/07/20 120/82  12/04/19 114/76  06/05/19 118/76    Wt Readings from Last 3 Encounters:  06/07/20 225 lb (102.1 kg)  12/04/19 217 lb (98.4 kg)  06/05/19 219 lb 3.2 oz (99.4 kg)    Physical Exam Constitutional:      General: He is not in acute distress.    Appearance: He is well-developed.     Comments: NAD  HENT:     Mouth/Throat:     Mouth: Oropharynx is clear and moist.  Eyes:     Conjunctiva/sclera: Conjunctivae normal.     Pupils: Pupils are equal, round, and reactive to light.  Neck:     Thyroid: No thyromegaly.     Vascular: No JVD.  Cardiovascular:     Rate and Rhythm: Normal rate and regular rhythm.     Pulses:  Intact distal pulses.     Heart sounds: Normal heart sounds. No murmur heard. No friction rub. No gallop.   Pulmonary:     Effort: Pulmonary effort is normal. No respiratory distress.     Breath sounds: Normal breath sounds. No wheezing or rales.  Chest:     Chest wall: No tenderness.  Abdominal:     General: Bowel sounds are normal. There is no distension.     Palpations: Abdomen is soft. There is no mass.     Tenderness: There is no abdominal tenderness. There is no guarding or rebound.  Genitourinary:    Prostate: Normal.     Rectum: Normal. Guaiac result negative.  Musculoskeletal:         General: No tenderness or edema. Normal range of motion.     Cervical back: Normal range of motion.  Lymphadenopathy:     Cervical: No cervical adenopathy.  Skin:    General: Skin is warm and dry.     Findings: No rash.  Neurological:     Mental Status: He is alert and oriented to person, place, and time.     Cranial Nerves: No cranial nerve deficit.     Motor: No abnormal muscle tone.     Coordination: He displays a negative Romberg sign. Coordination normal.     Gait: Gait normal.     Deep Tendon Reflexes: Reflexes are normal and symmetric.  Psychiatric:        Mood and Affect: Mood and affect normal.        Behavior: Behavior normal.        Thought Content: Thought content normal.        Judgment: Judgment normal.    prostate is <1+  Lab Results  Component Value Date   WBC 7.2 06/04/2020   HGB 14.8 06/04/2020   HCT 43.5 06/04/2020   PLT 233.0 06/04/2020   GLUCOSE 117 (H) 06/04/2020   CHOL 203 (H) 06/04/2020   TRIG 178.0 (H) 06/04/2020   HDL 40.90 06/04/2020   LDLDIRECT 135.6 01/25/2012   LDLCALC 127 (H) 06/04/2020   ALT 23 06/04/2020   AST 16 06/04/2020   NA 137 06/04/2020   K 4.3 06/04/2020   CL 102 06/04/2020   CREATININE 0.94 06/04/2020   BUN 13 06/04/2020   CO2 30 06/04/2020   TSH 1.52 06/04/2020   PSA 0.67 06/04/2020   INR 0.9 RATIO 01/28/2007   HGBA1C 6.7 (H) 06/04/2020    CT CARDIAC SCORING  Addendum Date: 02/07/2019   ADDENDUM REPORT: 02/07/2019 07:50 EXAM: OVER-READ INTERPRETATION  CT CHEST The following report is an over-read performed by radiologist Dr. Maryelizabeth Rowan [facility] on 02/07/2019. This over-read does not include interpretation of cardiac or coronary anatomy or pathology. The calcium score interpretation by the cardiologist is attached. COMPARISON:  None. FINDINGS: Limited view of the lung parenchyma demonstrates paraseptal emphysema in the LEFT lung. Airways are normal. Limited view of the mediastinum demonstrates no  adenopathy. Esophagus normal. Limited view of the upper abdomen unremarkable. Limited view of the skeleton and chest wall is unremarkable. IMPRESSION: 1. Paraseptal emphysema. 2. No acute findings. Electronically Signed   By: Genevive Bi M.D.   On: 02/07/2019 07:50   Result Date: 02/07/2019 CLINICAL DATA:  Risk stratification EXAM: Coronary Calcium Score MEDICATIONS: None TECHNIQUE: The patient was scanned on a [employer]. Axial non-contrast 3 mm slices were carried out through the heart. The data set was analyzed on a dedicated work station and scored  using the Agatson method. FINDINGS: Non-cardiac: See separate report from [facility]. Ascending Aorta: Normal size, trivial calcifications. Pericardium: Normal. Coronary arteries: Normal size. IMPRESSION: Coronary calcium score of 184. This was 77 percentile for age and sex matched control. Electronically Signed: By: Tobias Alexander On: 02/06/2019 19:00    Assessment & Plan:     Follow-up: No follow-ups on file.  Sonda Primes, MD

## 2020-06-07 NOTE — Assessment & Plan Note (Signed)
Smoking 1/2 ppd - discussed the need to stop

## 2020-06-07 NOTE — Assessment & Plan Note (Signed)
Smoking 1/2 ppd - discussed the need to stop 

## 2020-06-07 NOTE — Assessment & Plan Note (Signed)
Worse - we started Metformin

## 2020-06-07 NOTE — Assessment & Plan Note (Addendum)
  We discussed age appropriate health related issues, including available/recomended screening tests and vaccinations. Labs were ordered to be later reviewed . All questions were answered. We discussed one or more of the following - seat belt use, use of sunscreen/sun exposure exercise, safe sex, fall risk reduction, second hand smoke exposure, firearm use and storage, seat belt use, a need for adhering to healthy diet and exercise. Labs were ordered.  All questions were answered. Cologuard due Try to loose wt

## 2020-07-05 ENCOUNTER — Encounter: Payer: Self-pay | Admitting: Internal Medicine

## 2020-07-05 ENCOUNTER — Telehealth (INDEPENDENT_AMBULATORY_CARE_PROVIDER_SITE_OTHER): Payer: Federal, State, Local not specified - PPO | Admitting: Internal Medicine

## 2020-07-05 DIAGNOSIS — J209 Acute bronchitis, unspecified: Secondary | ICD-10-CM

## 2020-07-05 DIAGNOSIS — J441 Chronic obstructive pulmonary disease with (acute) exacerbation: Secondary | ICD-10-CM | POA: Diagnosis not present

## 2020-07-05 MED ORDER — DOXYCYCLINE HYCLATE 100 MG PO TABS: 100.0000 mg | ORAL_TABLET | Freq: Two times a day (BID) | ORAL | 0 refills | Status: DC

## 2020-07-05 MED ORDER — PROMETHAZINE-CODEINE 6.25-10 MG/5ML PO SYRP: 5.0000 mL | ORAL_SOLUTION | ORAL | 0 refills | Status: DC | PRN

## 2020-07-05 MED ORDER — METHYLPREDNISOLONE 4 MG PO TBPK: ORAL_TABLET | ORAL | 0 refills | Status: DC

## 2020-07-05 NOTE — Assessment & Plan Note (Signed)
Doxy x 10 d Prom-cod syr Rx Medrol pack po Call if not better

## 2020-07-05 NOTE — Assessment & Plan Note (Signed)
Doxy x 10 d Prom-cod syr Rx

## 2020-07-05 NOTE — Progress Notes (Signed)
Virtual Visit via Video Note  I connected with [name] [name] on 07/05/20 at  3:40 PM EST by a video enabled telemedicine application and verified that I am speaking with the correct person using two identifiers.   I discussed the limitations of evaluation and management by telemedicine and the availability of in person appointments. The patient expressed understanding and agreed to proceed.  I was located at our [facility] office. The patient was at home. There was no one else present in the visit.   History of Present Illness: Right started to have cough productive for clear white sputum on Friday.  He got worse on Saturday Sunday-he was wheezing, coughing more.  He seems to be doing a little better today.  There is no shortness of breath.  Observations/Objective: The patient appears to be in no acute distress, looks ok  Assessment and Plan:  See my Assessment and Plan. Follow Up Instructions:    I discussed the assessment and treatment plan with the patient. The patient was provided an opportunity to ask questions and all were answered. The patient agreed with the plan and demonstrated an understanding of the instructions.   The patient was advised to call back or seek an in-person evaluation if the symptoms worsen or if the condition fails to improve as anticipated.  I provided face-to-face time during this encounter. We were at different locations.   Sonda Primes, MD

## 2020-08-16 NOTE — Telephone Encounter (Signed)
Submitted via cover-my-meds w/ (Key: [id number]). Rec'd msg stating  " This request has received an approval. View the bottom of the request for an electronic copy of the approval letter.".Marland KitchenRaechel Chute

## 2020-10-10 ENCOUNTER — Other Ambulatory Visit: Payer: Self-pay | Admitting: Internal Medicine

## 2020-11-13 ENCOUNTER — Other Ambulatory Visit: Payer: Self-pay | Admitting: Internal Medicine

## 2020-11-22 DIAGNOSIS — L820 Inflamed seborrheic keratosis: Secondary | ICD-10-CM | POA: Diagnosis not present

## 2020-11-22 DIAGNOSIS — D0461 Carcinoma in situ of skin of right upper limb, including shoulder: Secondary | ICD-10-CM | POA: Diagnosis not present

## 2020-12-01 ENCOUNTER — Other Ambulatory Visit: Payer: Self-pay

## 2020-12-01 ENCOUNTER — Other Ambulatory Visit (INDEPENDENT_AMBULATORY_CARE_PROVIDER_SITE_OTHER): Payer: Federal, State, Local not specified - PPO

## 2020-12-01 DIAGNOSIS — E785 Hyperlipidemia, unspecified: Secondary | ICD-10-CM | POA: Diagnosis not present

## 2020-12-01 DIAGNOSIS — E669 Obesity, unspecified: Secondary | ICD-10-CM | POA: Diagnosis not present

## 2020-12-01 DIAGNOSIS — E1169 Type 2 diabetes mellitus with other specified complication: Secondary | ICD-10-CM | POA: Diagnosis not present

## 2020-12-01 LAB — LIPID PANEL
Cholesterol: 185 mg/dL (ref 0–200)
HDL: 41.5 mg/dL (ref >39.00)
LDL Cholesterol: 114 mg/dL — ABNORMAL HIGH (ref 0–99)
NonHDL: 143.41
Total CHOL/HDL Ratio: 4
Triglycerides: 145 mg/dL (ref 0.0–149.0)
VLDL: 29 mg/dL (ref 0.0–40.0)

## 2020-12-01 LAB — COMPREHENSIVE METABOLIC PANEL
ALT: 21 U/L (ref 0–53)
AST: 18 U/L (ref 0–37)
Albumin: 4.2 g/dL (ref 3.5–5.2)
Alkaline Phosphatase: 59 U/L (ref 39–117)
BUN: 10 mg/dL (ref 6–23)
CO2: 27 mEq/L (ref 19–32)
Calcium: 9.4 mg/dL (ref 8.4–10.5)
Chloride: 103 mEq/L (ref 96–112)
Creatinine, Ser: 0.88 mg/dL (ref 0.40–1.50)
GFR: 94.78 mL/min (ref >60.00)
Glucose, Bld: 103 mg/dL — ABNORMAL HIGH (ref 70–99)
Potassium: 4.1 mEq/L (ref 3.5–5.1)
Sodium: 138 mEq/L (ref 135–145)
Total Bilirubin: 0.4 mg/dL (ref 0.2–1.2)
Total Protein: 6.9 g/dL (ref 6.0–8.3)

## 2020-12-01 LAB — HEMOGLOBIN A1C: Hgb A1c MFr Bld: 6.6 % — ABNORMAL HIGH (ref 4.6–6.5)

## 2020-12-01 LAB — TSH: TSH: 0.28 u[IU]/mL — ABNORMAL LOW (ref 0.35–5.50)

## 2020-12-06 ENCOUNTER — Ambulatory Visit: Payer: Federal, State, Local not specified - PPO | Admitting: Internal Medicine

## 2020-12-06 ENCOUNTER — Encounter: Payer: Self-pay | Admitting: Internal Medicine

## 2020-12-06 ENCOUNTER — Other Ambulatory Visit: Payer: Self-pay

## 2020-12-06 VITALS — BP 120/82 | HR 83 | Temp 97.9°F | Ht 74.0 in | Wt 216.8 lb

## 2020-12-06 DIAGNOSIS — J441 Chronic obstructive pulmonary disease with (acute) exacerbation: Secondary | ICD-10-CM | POA: Diagnosis not present

## 2020-12-06 DIAGNOSIS — E669 Obesity, unspecified: Secondary | ICD-10-CM

## 2020-12-06 DIAGNOSIS — E1169 Type 2 diabetes mellitus with other specified complication: Secondary | ICD-10-CM | POA: Diagnosis not present

## 2020-12-06 DIAGNOSIS — Z720 Tobacco use: Secondary | ICD-10-CM | POA: Diagnosis not present

## 2020-12-06 DIAGNOSIS — F4323 Adjustment disorder with mixed anxiety and depressed mood: Secondary | ICD-10-CM

## 2020-12-06 DIAGNOSIS — M1990 Unspecified osteoarthritis, unspecified site: Secondary | ICD-10-CM

## 2020-12-06 DIAGNOSIS — E034 Atrophy of thyroid (acquired): Secondary | ICD-10-CM

## 2020-12-06 DIAGNOSIS — I700 Atherosclerosis of aorta: Secondary | ICD-10-CM

## 2020-12-06 DIAGNOSIS — E785 Hyperlipidemia, unspecified: Secondary | ICD-10-CM

## 2020-12-06 NOTE — Progress Notes (Signed)
Subjective:  Patient ID: [name] [name], male    DOB: 1961-03-28  Age: 59 y.o. MRN: [id number]  CC: Follow-up (6 month f/u)   HPI [name] [name] presents for DM, Hypothyroidism, obesity - loosing wt.  He is complaining of hand arthritis  Outpatient Medications Prior to Visit  Medication Sig Dispense Refill   Albuterol Sulfate (PROAIR RESPICLICK) 108 (90 Base) MCG/ACT AEPB Inhale 1-2 puffs into the lungs 4 (four) times daily as needed. 1 each 5   Armodafinil 150 MG tablet TAKE 1 TABLET BY MOUTH EVERY DAY IN THE MORNING 30 tablet 5   Budeson-Glycopyrrol-Formoterol (BREZTRI AEROSPHERE) 160-9-4.8 MCG/ACT AERO Inhale 2 puffs into the lungs in the morning and at bedtime. 10 g 11   cholecalciferol (VITAMIN D) 1000 UNITS tablet Take 1,000 Units by mouth daily.     levothyroxine (SYNTHROID) 150 MCG tablet TAKE 1 TABLET BY MOUTH EVERY DAY 90 tablet 3   metFORMIN (GLUCOPHAGE) 500 MG tablet Take 1 tablet (500 mg total) by mouth daily with breakfast. 90 tablet 3   montelukast (SINGULAIR) 10 MG tablet TAKE 1 TABLET BY MOUTH EVERY DAY 90 tablet 3   omeprazole (PRILOSEC) 40 MG capsule TAKE 1 CAPSULE BY MOUTH EVERY DAY 90 capsule 2   simvastatin (ZOCOR) 40 MG tablet TAKE 1 TABLET BY MOUTH EVERY DAY 90 tablet 2   doxycycline (VIBRA-TABS) 100 MG tablet Take 1 tablet (100 mg total) by mouth 2 (two) times daily. 20 tablet 0   methylPREDNISolone (MEDROL DOSEPAK) 4 MG TBPK tablet As directed 21 tablet 0   promethazine-codeine (PHENERGAN WITH CODEINE) 6.25-10 MG/5ML syrup Take 5 mLs by mouth every 4 (four) hours as needed. 300 mL 0   No facility-administered medications prior to visit.    ROS: Review of Systems  Constitutional:  Negative for appetite change, fatigue and unexpected weight change.  HENT:  Negative for congestion, nosebleeds, sneezing, sore throat and trouble swallowing.   Eyes:  Negative for itching and visual disturbance.  Respiratory:  Negative for cough.   Cardiovascular:  Negative  for chest pain, palpitations and leg swelling.  Gastrointestinal:  Negative for abdominal distention, blood in stool, diarrhea and nausea.  Genitourinary:  Negative for frequency and hematuria.  Musculoskeletal:  Negative for back pain, gait problem, joint swelling and neck pain.  Skin:  Negative for rash.  Neurological:  Negative for dizziness, tremors, speech difficulty and weakness.  Psychiatric/Behavioral:  Negative for agitation, dysphoric mood, sleep disturbance and suicidal ideas. The patient is not nervous/anxious.    Objective:  BP 120/82 (BP Location: Left Arm)   Pulse 83   Temp 97.9 F (36.6 C) (Oral)   Ht 6\' 2"  (1.88 m)   Wt 216 lb 12.8 oz (98.3 kg)   SpO2 97%   BMI 27.84 kg/m   BP Readings from Last 3 Encounters:  12/06/20 120/82  06/07/20 120/82  12/04/19 114/76    Wt Readings from Last 3 Encounters:  12/06/20 216 lb 12.8 oz (98.3 kg)  06/07/20 225 lb (102.1 kg)  12/04/19 217 lb (98.4 kg)    Physical Exam Constitutional:      General: He is not in acute distress.    Appearance: He is well-developed. He is obese.     Comments: NAD  Eyes:     Conjunctiva/sclera: Conjunctivae normal.     Pupils: Pupils are equal, round, and reactive to light.  Neck:     Thyroid: No thyromegaly.     Vascular: No JVD.  Cardiovascular:  Rate and Rhythm: Normal rate and regular rhythm.     Heart sounds: Normal heart sounds. No murmur heard.   No friction rub. No gallop.  Pulmonary:     Effort: Pulmonary effort is normal. No respiratory distress.     Breath sounds: Normal breath sounds. No wheezing or rales.  Chest:     Chest wall: No tenderness.  Abdominal:     General: Bowel sounds are normal. There is no distension.     Palpations: Abdomen is soft. There is no mass.     Tenderness: There is no abdominal tenderness. There is no guarding or rebound.  Musculoskeletal:        General: No tenderness. Normal range of motion.     Cervical back: Normal range of motion.   Lymphadenopathy:     Cervical: No cervical adenopathy.  Skin:    General: Skin is warm and dry.     Findings: No rash.  Neurological:     Mental Status: He is alert and oriented to person, place, and time.     Cranial Nerves: No cranial nerve deficit.     Motor: No abnormal muscle tone.     Coordination: Coordination normal.     Gait: Gait normal.     Deep Tendon Reflexes: Reflexes are normal and symmetric.  Psychiatric:        Behavior: Behavior normal.        Thought Content: Thought content normal.        Judgment: Judgment normal.    Lab Results  Component Value Date   WBC 7.2 06/04/2020   HGB 14.8 06/04/2020   HCT 43.5 06/04/2020   PLT 233.0 06/04/2020   GLUCOSE 103 (H) 12/01/2020   CHOL 185 12/01/2020   TRIG 145.0 12/01/2020   HDL 41.50 12/01/2020   LDLDIRECT 135.6 01/25/2012   LDLCALC 114 (H) 12/01/2020   ALT 21 12/01/2020   AST 18 12/01/2020   NA 138 12/01/2020   K 4.1 12/01/2020   CL 103 12/01/2020   CREATININE 0.88 12/01/2020   BUN 10 12/01/2020   CO2 27 12/01/2020   TSH 0.28 (L) 12/01/2020   PSA 0.67 06/04/2020   INR 0.9 RATIO 01/28/2007   HGBA1C 6.6 (H) 12/01/2020    CT CARDIAC SCORING  Addendum Date: 02/07/2019   ADDENDUM REPORT: 02/07/2019 07:50 EXAM: OVER-READ INTERPRETATION  CT CHEST The following report is an over-read performed by radiologist Dr. Maryelizabeth Rowan [facility] on 02/07/2019. This over-read does not include interpretation of cardiac or coronary anatomy or pathology. The calcium score interpretation by the cardiologist is attached. COMPARISON:  None. FINDINGS: Limited view of the lung parenchyma demonstrates paraseptal emphysema in the LEFT lung. Airways are normal. Limited view of the mediastinum demonstrates no adenopathy. Esophagus normal. Limited view of the upper abdomen unremarkable. Limited view of the skeleton and chest wall is unremarkable. IMPRESSION: 1. Paraseptal emphysema. 2. No acute findings. Electronically  Signed   By: Genevive Bi M.D.   On: 02/07/2019 07:50   Result Date: 02/07/2019 CLINICAL DATA:  Risk stratification EXAM: Coronary Calcium Score MEDICATIONS: None TECHNIQUE: The patient was scanned on a [employer]. Axial non-contrast 3 mm slices were carried out through the heart. The data set was analyzed on a dedicated work station and scored using the Agatson method. FINDINGS: Non-cardiac: See separate report from [facility]. Ascending Aorta: Normal size, trivial calcifications. Pericardium: Normal. Coronary arteries: Normal size. IMPRESSION: Coronary calcium score of 184. This was 36 percentile for age and  sex matched control. Electronically Signed: By: Tobias Alexander On: 02/06/2019 19:00    Assessment & Plan:    Sonda Primes, MD

## 2020-12-06 NOTE — Assessment & Plan Note (Signed)
On Metfirmin Check A1c

## 2020-12-06 NOTE — Assessment & Plan Note (Signed)
On Simvastatin 

## 2020-12-06 NOTE — Assessment & Plan Note (Signed)
Refractory smoking 1/2 ppd - discussed the need to stop

## 2020-12-06 NOTE — Assessment & Plan Note (Signed)
Off Effexor, doing better

## 2020-12-06 NOTE — Assessment & Plan Note (Signed)
Continue with simvastatin 

## 2020-12-06 NOTE — Assessment & Plan Note (Addendum)
Abn TSH.  Continue with Levothroid FT4 pending

## 2020-12-06 NOTE — Assessment & Plan Note (Signed)
Not using MDIs 

## 2020-12-06 NOTE — Patient Instructions (Addendum)
For a mild COVID-19 case - take zinc 50 mg a day for 1 week, vitamin C 1000 mg daily for 1 week, vitamin D2 50,000 units weekly for 2 months (unless  taking vitamin D daily already), an antioxidant Quercetin 500 mg twice a day for 1 week (if you can get it quick enough). Take Allegra or Benadryl.  Maintain good oral hydration and take Tylenol for high fever.  Call if problems. Isolate for 5 days, then wear a mask for 5 days per CDC.  The Obesity Code book by Wylene Simmer   These suggestions will probably help you to improve your metabolism if you are not overweight and to lose weight if you are overweight: 1.  Reduce your consumption of sugars and starches.  Eliminate high fructose corn syrup from your diet.  Reduce your consumption of processed foods.  For desserts try to have seasonal fruits, berries, nuts, cheeses or dark chocolate with more than 70% cacao. 2.  Do not snack 3.  You do not have to eat breakfast.  If you choose to have breakfast - eat plain greek yogurt, eggs, oatmeal (without sugar) - use honey if you need to. 4.  Drink water, freshly brewed unsweetened tea (green, black or herbal) or coffee.  Do not drink sodas including diet sodas , juices, beverages sweetened with artificial sweeteners. 5.  Reduce your consumption of refined grains. 6.  Avoid protein drinks such as Optifast, Slim fast etc. Eat chicken, fish, meat, dairy and beans for your sources of protein. 7.  Natural unprocessed fats like cold pressed virgin olive oil, butter, coconut oil are good for you.  Eat avocados. 8.  Increase your consumption of fiber.  Fruits, berries, vegetables, whole grains, flaxseed, chia seeds, beans, popcorn, nuts, oatmeal are good sources of fiber 9.  Use vinegar in your diet, i.e. apple cider vinegar, red wine or balsamic vinegar 10.  You can try fasting.  For example you can skip breakfast and lunch every other day (24-hour fast) 11.  Stress reduction, good night sleep, relaxation,  meditation, yoga and other physical activity is likely to help you to maintain low weight too. 12.  If you drink alcohol, limit your alcohol intake to no more than 2 drinks a day.   Mediterranean diet is good for you. ([employer] has a typical Mediterranean cuisine menu) The Mediterranean diet is a way of eating based on the traditional cuisine of countries bordering the [employer]. While there is no single definition of the Mediterranean diet, it is typically high in vegetables, fruits, whole grains, beans, nut and seeds, and olive oil. The main components of Mediterranean diet include: Daily consumption of vegetables, fruits, whole grains and healthy fats  Weekly intake of fish, poultry, beans and eggs  Moderate portions of dairy products  Limited intake of red meat Other important elements of the Mediterranean diet are sharing meals with family and friends, enjoying a glass of red wine and being physically active. Health benefits of a Mediterranean diet: A traditional Mediterranean diet consisting of large quantities of fresh fruits and vegetables, nuts, fish and olive oil--coupled with physical activity--can reduce your risk of serious mental and physical health problems by: Preventing heart disease and strokes. Following a Mediterranean diet limits your intake of refined breads, processed foods, and red meat, and encourages drinking red wine instead of hard liquor--all factors that can help prevent heart disease and stroke. Keeping you agile. If you're an older adult, the nutrients gained with a Mediterranean  diet may reduce your risk of developing muscle weakness and other signs of frailty by about 70 percent. Reducing the risk of Alzheimer's. Research suggests that the Mediterranean diet may improve cholesterol, blood sugar levels, and overall blood vessel health, which in turn may reduce your risk of Alzheimer's disease or dementia. Halving the risk of Parkinson's disease. The high  levels of antioxidants in the Mediterranean diet can prevent cells from undergoing a damaging process called oxidative stress, thereby cutting the risk of Parkinson's disease in half. Increasing longevity. By reducing your risk of developing heart disease or cancer with the Mediterranean diet, you're reducing your risk of death at any age by 20%. Protecting against type 2 diabetes. A Mediterranean diet is rich in fiber which digests slowly, prevents huge swings in blood sugar, and can help you maintain a healthy weight.    Cabbage soup recipe that will not make you gain weight: Take 1 small head of cabbage, 1 average pack of celery, 4 green peppers, 4 onions, 2 cans diced tomatoes (they are not available without salt), salt and spices to taste.  Chop cabbage, celery, peppers and onions.  And tomatoes and 2-2.5 liters (2.5 quarts) of water so that it would just cover the vegetables.  Bring to boil.  Add spices and salt.  Turn heat to low/medium and simmer for 20-25 minutes.  Naturally, you can make a smaller batch and change some of the ingredients.

## 2020-12-06 NOTE — Assessment & Plan Note (Addendum)
Use a thumb brace.  Steroid injection offered

## 2021-01-09 ENCOUNTER — Other Ambulatory Visit: Payer: Self-pay | Admitting: Internal Medicine

## 2021-02-17 DIAGNOSIS — G4733 Obstructive sleep apnea (adult) (pediatric): Secondary | ICD-10-CM | POA: Diagnosis not present

## 2021-02-17 DIAGNOSIS — Z9989 Dependence on other enabling machines and devices: Secondary | ICD-10-CM | POA: Diagnosis not present

## 2021-02-20 LAB — COLOGUARD: COLOGUARD: NEGATIVE

## 2021-02-20 LAB — EXTERNAL GENERIC LAB PROCEDURE: COLOGUARD: NEGATIVE

## 2021-02-21 DIAGNOSIS — G4733 Obstructive sleep apnea (adult) (pediatric): Secondary | ICD-10-CM | POA: Diagnosis not present

## 2021-03-05 IMAGING — CT CT HEART SCORING
2 series · 16 of 20 positions shown, 18 images · non-contrast
Comparison: None.

Addendum:
CLINICAL DATA: Risk stratification

EXAM:
Coronary Calcium Score
MEDICATIONS:
None
TECHNIQUE: The patient was scanned on a Siemens Force scanner. Axial
non-contrast 3 mm slices were carried out through the heart. The
data set was analyzed on a dedicated work station and scored using
the Agatson method.

[Series 2: casc 3.0 i36f 2 bestdiast 68 % · axial · 0.37mm/px · z∈[-289,-163]mm · 8 of 56 slices shown, 10 images]
[im 7/56  vessel]
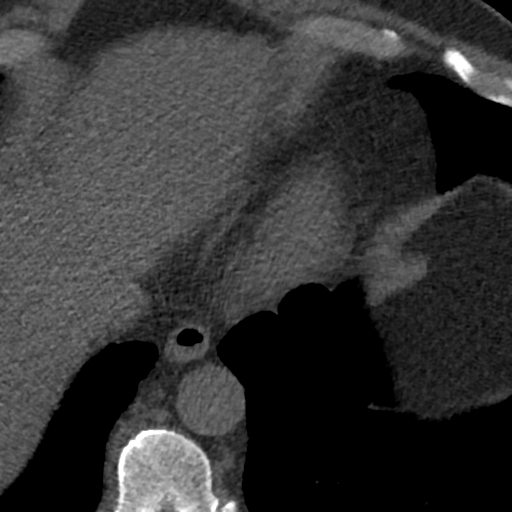
[im 7/56  lung]
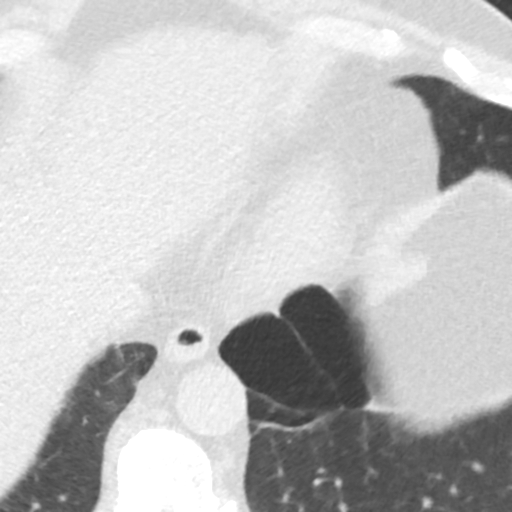
[im 13/56  vessel]
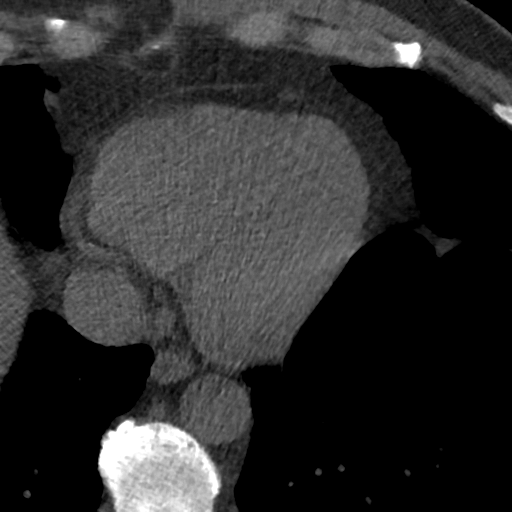
[im 19/56  vessel]
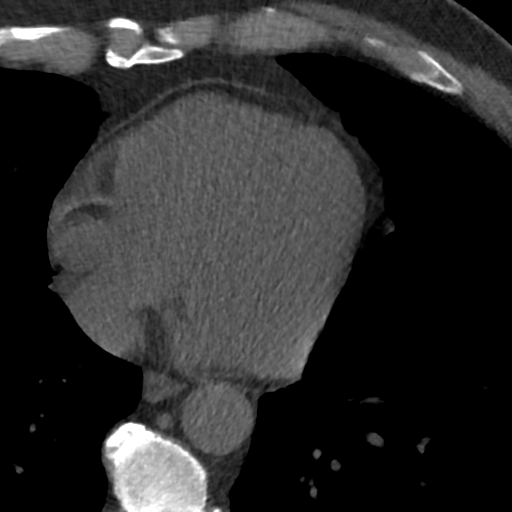
[im 25/56  vessel]
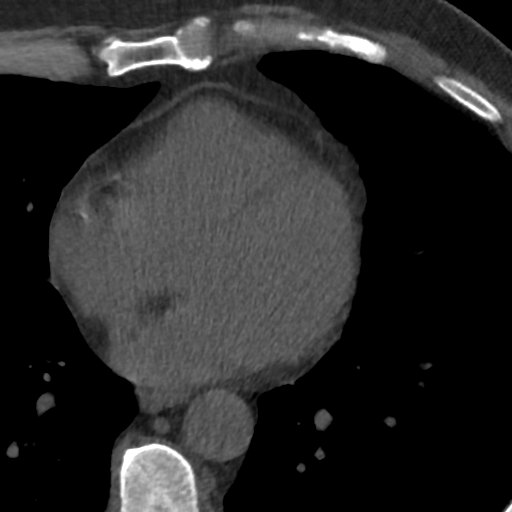
[im 31/56  vessel]
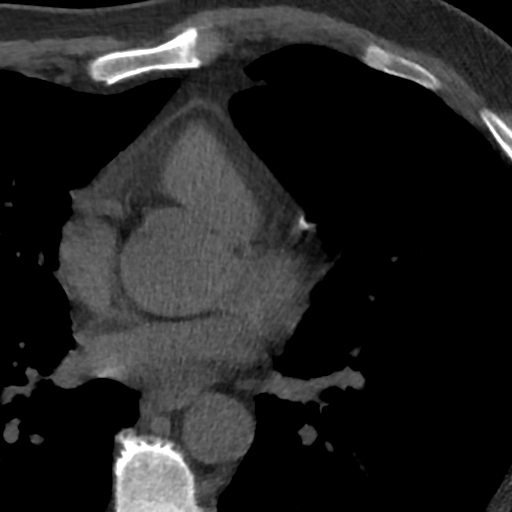
[im 31/56  lung]
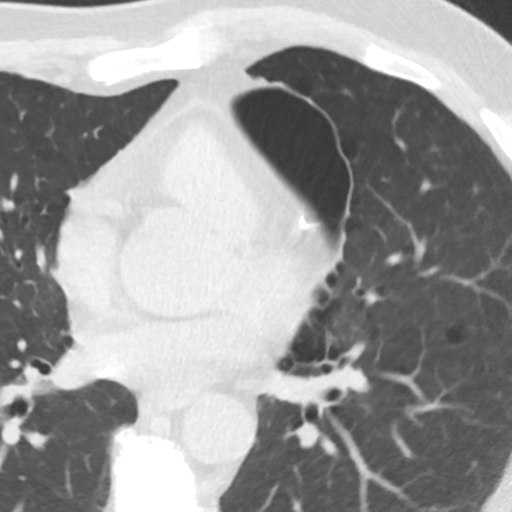
[im 37/56  vessel]
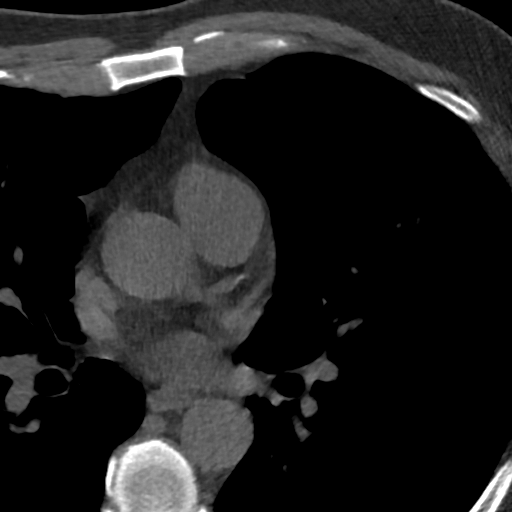
[im 43/56  vessel]
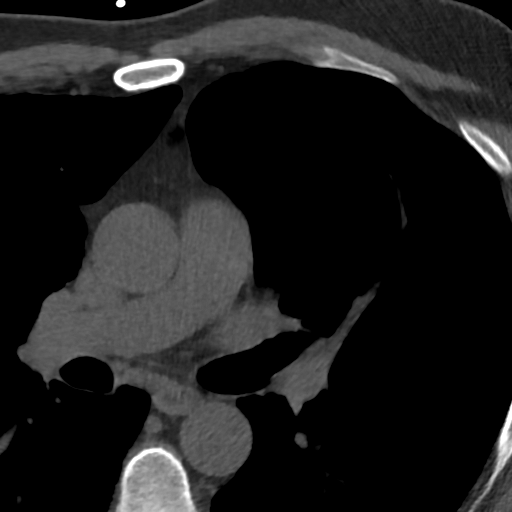
[im 49/56  vessel]
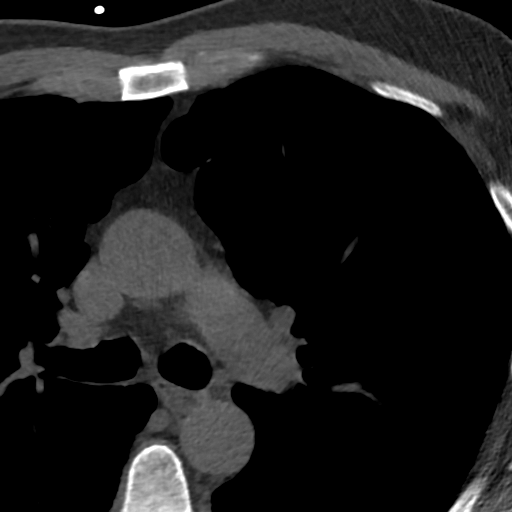

[Series 4: lung st 68 % · axial · 0.74mm/px · z∈[-289,-163]mm · 8 of 56 slices shown]
[im 7/56  lung]
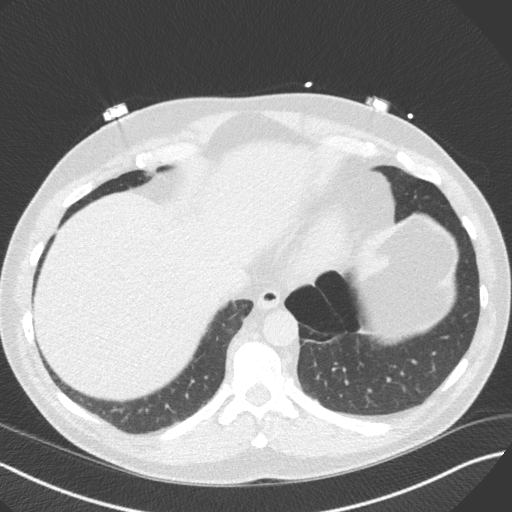
[im 13/56  lung]
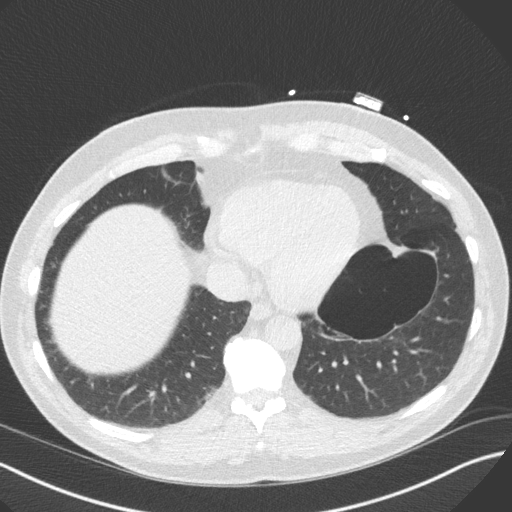
[im 19/56  lung]
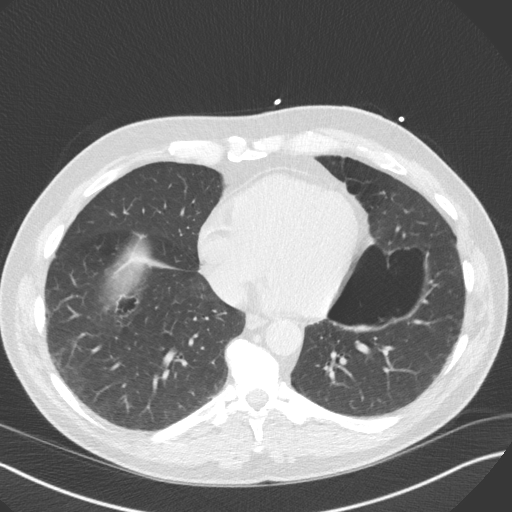
[im 25/56  lung]
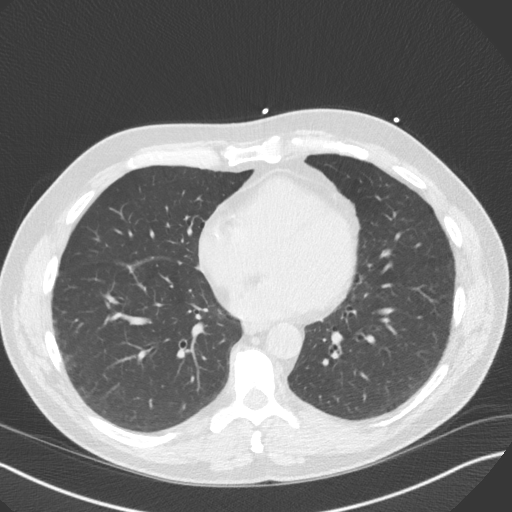
[im 31/56  lung]
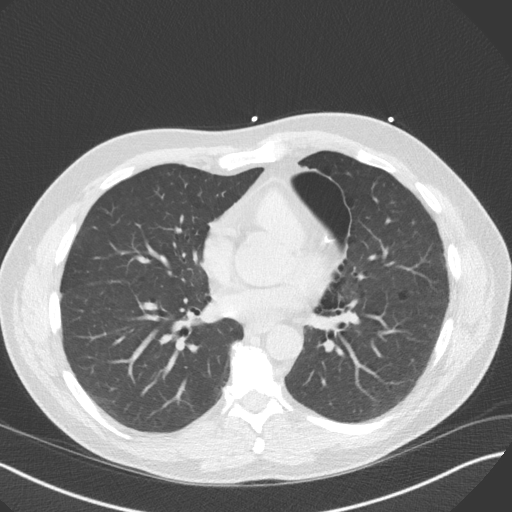
[im 37/56  lung]
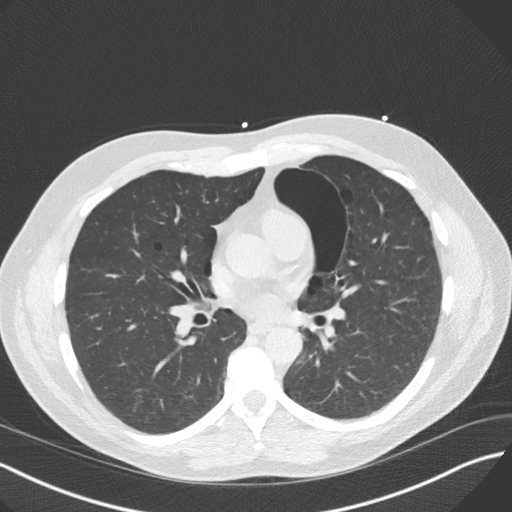
[im 43/56  lung]
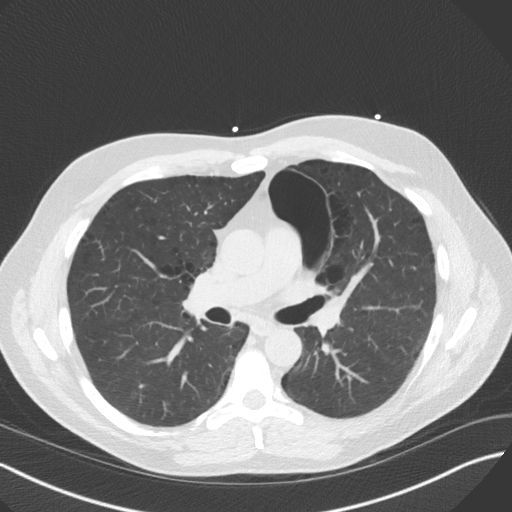
[im 49/56  lung]
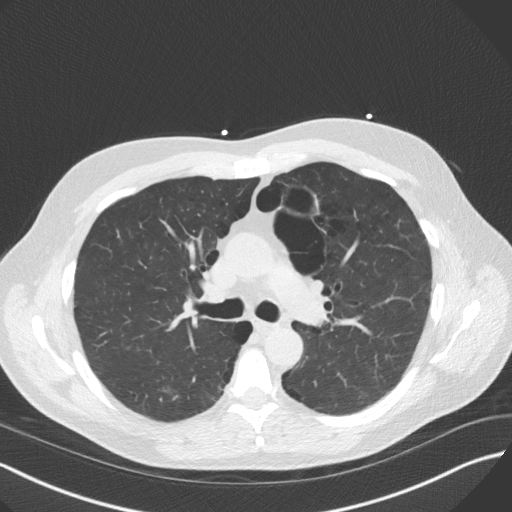

[16 of 20 positions shown; findings below may reference images not displayed]

FINDINGS: Non-cardiac: See separate report from [REDACTED].

Ascending Aorta: Normal size, trivial calcifications.

Pericardium: Normal.

Coronary arteries: Normal size.
IMPRESSION: Coronary calcium score of 184. This was 85 percentile for age and
sex matched control.

EXAM:
OVER-READ INTERPRETATION  CT CHEST

The following report is an over-read performed by radiologist Dr.
over-read does not include interpretation of cardiac or coronary
anatomy or pathology. The calcium score interpretation by the
cardiologist is attached.
FINDINGS: Limited view of the lung parenchyma demonstrates paraseptal
emphysema in the LEFT lung. Airways are normal.

Limited view of the mediastinum demonstrates no adenopathy.
Esophagus normal.

Limited view of the upper abdomen unremarkable.

Limited view of the skeleton and chest wall is unremarkable.
IMPRESSION: 1. Paraseptal emphysema.
2. No acute findings.

*** End of Addendum ***
FINDINGS: Non-cardiac: See separate report from [REDACTED].

Ascending Aorta: Normal size, trivial calcifications.

Pericardium: Normal.

Coronary arteries: Normal size.
IMPRESSION: Coronary calcium score of 184. This was 85 percentile for age and
sex matched control.

## 2021-03-07 ENCOUNTER — Encounter: Payer: Self-pay | Admitting: *Deleted

## 2021-03-24 DIAGNOSIS — G4733 Obstructive sleep apnea (adult) (pediatric): Secondary | ICD-10-CM | POA: Diagnosis not present

## 2021-04-05 ENCOUNTER — Other Ambulatory Visit (INDEPENDENT_AMBULATORY_CARE_PROVIDER_SITE_OTHER): Payer: Federal, State, Local not specified - PPO

## 2021-04-05 ENCOUNTER — Other Ambulatory Visit: Payer: Self-pay

## 2021-04-05 DIAGNOSIS — E1169 Type 2 diabetes mellitus with other specified complication: Secondary | ICD-10-CM

## 2021-04-05 DIAGNOSIS — E034 Atrophy of thyroid (acquired): Secondary | ICD-10-CM | POA: Diagnosis not present

## 2021-04-05 DIAGNOSIS — E669 Obesity, unspecified: Secondary | ICD-10-CM

## 2021-04-05 LAB — COMPREHENSIVE METABOLIC PANEL
ALT: 26 U/L (ref 0–53)
AST: 18 U/L (ref 0–37)
Albumin: 4.2 g/dL (ref 3.5–5.2)
Alkaline Phosphatase: 64 U/L (ref 39–117)
BUN: 10 mg/dL (ref 6–23)
CO2: 27 mEq/L (ref 19–32)
Calcium: 9.6 mg/dL (ref 8.4–10.5)
Chloride: 103 mEq/L (ref 96–112)
Creatinine, Ser: 0.88 mg/dL (ref 0.40–1.50)
GFR: 94.55 mL/min (ref >60.00)
Glucose, Bld: 106 mg/dL — ABNORMAL HIGH (ref 70–99)
Potassium: 4.4 mEq/L (ref 3.5–5.1)
Sodium: 138 mEq/L (ref 135–145)
Total Bilirubin: 0.3 mg/dL (ref 0.2–1.2)
Total Protein: 6.8 g/dL (ref 6.0–8.3)

## 2021-04-05 LAB — HEMOGLOBIN A1C: Hgb A1c MFr Bld: 6.6 % — ABNORMAL HIGH (ref 4.6–6.5)

## 2021-04-05 LAB — TSH: TSH: 0.56 u[IU]/mL (ref 0.35–5.50)

## 2021-04-05 LAB — T4, FREE: Free T4: 1.02 ng/dL (ref 0.60–1.60)

## 2021-04-07 ENCOUNTER — Encounter: Payer: Self-pay | Admitting: Internal Medicine

## 2021-04-07 ENCOUNTER — Other Ambulatory Visit: Payer: Self-pay

## 2021-04-07 ENCOUNTER — Ambulatory Visit: Payer: Federal, State, Local not specified - PPO | Admitting: Internal Medicine

## 2021-04-07 DIAGNOSIS — E034 Atrophy of thyroid (acquired): Secondary | ICD-10-CM

## 2021-04-07 DIAGNOSIS — E669 Obesity, unspecified: Secondary | ICD-10-CM | POA: Diagnosis not present

## 2021-04-07 DIAGNOSIS — E1169 Type 2 diabetes mellitus with other specified complication: Secondary | ICD-10-CM

## 2021-04-07 DIAGNOSIS — Z23 Encounter for immunization: Secondary | ICD-10-CM | POA: Diagnosis not present

## 2021-04-07 DIAGNOSIS — E785 Hyperlipidemia, unspecified: Secondary | ICD-10-CM | POA: Diagnosis not present

## 2021-04-07 MED ORDER — RYBELSUS 3 MG PO TABS: 3.0000 mg | ORAL_TABLET | Freq: Every day | ORAL | 3 refills | Status: DC

## 2021-04-07 NOTE — Assessment & Plan Note (Signed)
On Levothroid 

## 2021-04-07 NOTE — Assessment & Plan Note (Addendum)
On Metformin A1c 6.6% Start Rybelsus

## 2021-04-07 NOTE — Assessment & Plan Note (Signed)
Cont on Simvastatin 

## 2021-04-07 NOTE — Progress Notes (Signed)
Subjective:  Patient ID: [name] [name], male    DOB: [birth year]  Age: 60 y.o. MRN: [id number]  CC: Follow-up (4 MONTH F/U- FLU SHOT)   HPI [name] [name] presents for COPD, DM, hypothyroidism Pt ran and walked  5K  Outpatient Medications Prior to Visit  Medication Sig Dispense Refill   Albuterol Sulfate (PROAIR RESPICLICK) 123XX123 (90 Base) MCG/ACT AEPB Inhale 1-2 puffs into the lungs 4 (four) times daily as needed. 1 each 5   Armodafinil 150 MG tablet TAKE 1 TABLET BY MOUTH EVERY DAY IN THE MORNING 30 tablet 5   cholecalciferol (VITAMIN D) 1000 UNITS tablet Take 1,000 Units by mouth daily.     levothyroxine (SYNTHROID) 150 MCG tablet TAKE 1 TABLET BY MOUTH EVERY DAY 90 tablet 3   metFORMIN (GLUCOPHAGE) 500 MG tablet Take 1 tablet (500 mg total) by mouth daily with breakfast. 90 tablet 3   montelukast (SINGULAIR) 10 MG tablet TAKE 1 TABLET BY MOUTH EVERY DAY 90 tablet 3   omeprazole (PRILOSEC) 40 MG capsule TAKE 1 CAPSULE BY MOUTH EVERY DAY 90 capsule 2   simvastatin (ZOCOR) 40 MG tablet TAKE 1 TABLET BY MOUTH EVERY DAY 90 tablet 2   Budeson-Glycopyrrol-Formoterol (BREZTRI AEROSPHERE) 160-9-4.8 MCG/ACT AERO Inhale 2 puffs into the lungs in the morning and at bedtime. 10 g 11   No facility-administered medications prior to visit.    ROS: Review of Systems  Constitutional:  Negative for appetite change, fatigue and unexpected weight change.  HENT:  Negative for congestion, nosebleeds, sneezing, sore throat and trouble swallowing.   Eyes:  Negative for itching and visual disturbance.  Respiratory:  Positive for cough.   Cardiovascular:  Negative for chest pain, palpitations and leg swelling.  Gastrointestinal:  Negative for abdominal distention, blood in stool, diarrhea and nausea.  Genitourinary:  Negative for frequency and hematuria.  Musculoskeletal:  Negative for back pain, gait problem, joint swelling and neck pain.  Skin:  Negative for rash.  Neurological:  Negative for  dizziness, tremors, speech difficulty and weakness.  Psychiatric/Behavioral:  Negative for agitation, dysphoric mood and sleep disturbance. The patient is not nervous/anxious.    Objective:  BP 112/78 (BP Location: Left Arm)   Pulse 86   Temp 98 F (36.7 C) (Oral)   Ht 6\' 2"  (1.88 m)   Wt 222 lb 3.2 oz (100.8 kg)   SpO2 96%   BMI 28.53 kg/m   BP Readings from Last 3 Encounters:  04/07/21 112/78  12/06/20 120/82  06/07/20 120/82    Wt Readings from Last 3 Encounters:  04/07/21 222 lb 3.2 oz (100.8 kg)  12/06/20 216 lb 12.8 oz (98.3 kg)  06/07/20 225 lb (102.1 kg)    Physical Exam Constitutional:      General: He is not in acute distress.    Appearance: He is well-developed.     Comments: NAD  Eyes:     Conjunctiva/sclera: Conjunctivae normal.     Pupils: Pupils are equal, round, and reactive to light.  Neck:     Thyroid: No thyromegaly.     Vascular: No JVD.  Cardiovascular:     Rate and Rhythm: Normal rate and regular rhythm.     Heart sounds: Normal heart sounds. No murmur heard.   No friction rub. No gallop.  Pulmonary:     Effort: Pulmonary effort is normal. No respiratory distress.     Breath sounds: Normal breath sounds. No wheezing or rales.  Chest:     Chest wall: No  tenderness.  Abdominal:     General: Bowel sounds are normal. There is no distension.     Palpations: Abdomen is soft. There is no mass.     Tenderness: There is no abdominal tenderness. There is no guarding or rebound.  Musculoskeletal:        General: No tenderness. Normal range of motion.     Cervical back: Normal range of motion.  Lymphadenopathy:     Cervical: No cervical adenopathy.  Skin:    General: Skin is warm and dry.     Findings: No rash.  Neurological:     Mental Status: He is alert and oriented to person, place, and time.     Cranial Nerves: No cranial nerve deficit.     Motor: No abnormal muscle tone.     Coordination: Coordination normal.     Gait: Gait normal.      Deep Tendon Reflexes: Reflexes are normal and symmetric.  Psychiatric:        Behavior: Behavior normal.        Thought Content: Thought content normal.        Judgment: Judgment normal.    Lab Results  Component Value Date   WBC 7.2 06/04/2020   HGB 14.8 06/04/2020   HCT 43.5 06/04/2020   PLT 233.0 06/04/2020   GLUCOSE 106 (H) 04/05/2021   CHOL 185 12/01/2020   TRIG 145.0 12/01/2020   HDL 41.50 12/01/2020   LDLDIRECT 135.6 01/25/2012   LDLCALC 114 (H) 12/01/2020   ALT 26 04/05/2021   AST 18 04/05/2021   NA 138 04/05/2021   K 4.4 04/05/2021   CL 103 04/05/2021   CREATININE 0.88 04/05/2021   BUN 10 04/05/2021   CO2 27 04/05/2021   TSH 0.56 04/05/2021   PSA 0.67 06/04/2020   INR 0.9 RATIO 01/28/2007   HGBA1C 6.6 (H) 04/05/2021    CT CARDIAC SCORING  Addendum Date: 02/07/2019   ADDENDUM REPORT: 02/07/2019 07:50 EXAM: OVER-READ INTERPRETATION  CT CHEST The following report is an over-read performed by radiologist Dr. Maryelizabeth Rowan [facility] on 02/07/2019. This over-read does not include interpretation of cardiac or coronary anatomy or pathology. The calcium score interpretation by the cardiologist is attached. COMPARISON:  None. FINDINGS: Limited view of the lung parenchyma demonstrates paraseptal emphysema in the LEFT lung. Airways are normal. Limited view of the mediastinum demonstrates no adenopathy. Esophagus normal. Limited view of the upper abdomen unremarkable. Limited view of the skeleton and chest wall is unremarkable. IMPRESSION: 1. Paraseptal emphysema. 2. No acute findings. Electronically Signed   By: Genevive Bi M.D.   On: 02/07/2019 07:50   Result Date: 02/07/2019 CLINICAL DATA:  Risk stratification EXAM: Coronary Calcium Score MEDICATIONS: None TECHNIQUE: The patient was scanned on a [employer]. Axial non-contrast 3 mm slices were carried out through the heart. The data set was analyzed on a dedicated work station and scored using the  Agatson method. FINDINGS: Non-cardiac: See separate report from [facility]. Ascending Aorta: Normal size, trivial calcifications. Pericardium: Normal. Coronary arteries: Normal size. IMPRESSION: Coronary calcium score of 184. This was 40 percentile for age and sex matched control. Electronically Signed: By: Tobias Alexander On: 02/06/2019 19:00    Assessment & Plan:   Problem List Items Addressed This Visit     Diabetes mellitus type 2 in obese (HCC)    On Metformin A1c 6.6% Start Rybelsus      Relevant Medications   Semaglutide (RYBELSUS) 3 MG TABS   Other Relevant  Orders   Hemoglobin A1c   Comprehensive metabolic panel   Dyslipidemia    Cont on Simvastatin      Hypothyroidism    On Levothroid         Meds ordered this encounter  Medications   Semaglutide (RYBELSUS) 3 MG TABS    Sig: Take 3 mg by mouth daily.    Dispense:  30 tablet    Refill:  3       Follow-up: Return in about 3 months (around 07/06/2021) for a follow-up visit.  Walker Kehr, MD

## 2021-04-21 DIAGNOSIS — L821 Other seborrheic keratosis: Secondary | ICD-10-CM | POA: Diagnosis not present

## 2021-04-21 DIAGNOSIS — D225 Melanocytic nevi of trunk: Secondary | ICD-10-CM | POA: Diagnosis not present

## 2021-04-21 DIAGNOSIS — L820 Inflamed seborrheic keratosis: Secondary | ICD-10-CM | POA: Diagnosis not present

## 2021-04-21 DIAGNOSIS — L905 Scar conditions and fibrosis of skin: Secondary | ICD-10-CM | POA: Diagnosis not present

## 2021-04-21 DIAGNOSIS — Z85828 Personal history of other malignant neoplasm of skin: Secondary | ICD-10-CM | POA: Diagnosis not present

## 2021-04-21 DIAGNOSIS — L570 Actinic keratosis: Secondary | ICD-10-CM | POA: Diagnosis not present

## 2021-04-23 DIAGNOSIS — G4733 Obstructive sleep apnea (adult) (pediatric): Secondary | ICD-10-CM | POA: Diagnosis not present

## 2021-05-25 ENCOUNTER — Other Ambulatory Visit: Payer: Self-pay | Admitting: Internal Medicine

## 2021-06-22 ENCOUNTER — Encounter: Payer: Self-pay | Admitting: Internal Medicine

## 2021-06-29 ENCOUNTER — Other Ambulatory Visit: Payer: Self-pay | Admitting: Internal Medicine

## 2021-06-29 DIAGNOSIS — R1310 Dysphagia, unspecified: Secondary | ICD-10-CM

## 2021-06-29 NOTE — Telephone Encounter (Signed)
Pt is requesting advise to see if he needs a specialist for pills getting stuck in his throat ?

## 2021-07-04 ENCOUNTER — Other Ambulatory Visit (INDEPENDENT_AMBULATORY_CARE_PROVIDER_SITE_OTHER): Payer: Federal, State, Local not specified - PPO

## 2021-07-04 DIAGNOSIS — E669 Obesity, unspecified: Secondary | ICD-10-CM

## 2021-07-04 DIAGNOSIS — E1169 Type 2 diabetes mellitus with other specified complication: Secondary | ICD-10-CM

## 2021-07-04 LAB — COMPREHENSIVE METABOLIC PANEL
ALT: 27 U/L (ref 0–53)
AST: 20 U/L (ref 0–37)
Albumin: 4.4 g/dL (ref 3.5–5.2)
Alkaline Phosphatase: 59 U/L (ref 39–117)
BUN: 12 mg/dL (ref 6–23)
CO2: 28 mEq/L (ref 19–32)
Calcium: 9.6 mg/dL (ref 8.4–10.5)
Chloride: 102 mEq/L (ref 96–112)
Creatinine, Ser: 1 mg/dL (ref 0.40–1.50)
GFR: 82.62 mL/min (ref >60.00)
Glucose, Bld: 94 mg/dL (ref 70–99)
Potassium: 3.8 mEq/L (ref 3.5–5.1)
Sodium: 137 mEq/L (ref 135–145)
Total Bilirubin: 0.5 mg/dL (ref 0.2–1.2)
Total Protein: 6.9 g/dL (ref 6.0–8.3)

## 2021-07-04 LAB — HEMOGLOBIN A1C: Hgb A1c MFr Bld: 6.3 % (ref 4.6–6.5)

## 2021-07-07 ENCOUNTER — Other Ambulatory Visit: Payer: Self-pay

## 2021-07-07 ENCOUNTER — Encounter: Payer: Self-pay | Admitting: Internal Medicine

## 2021-07-07 ENCOUNTER — Ambulatory Visit: Payer: Federal, State, Local not specified - PPO | Admitting: Internal Medicine

## 2021-07-07 DIAGNOSIS — E034 Atrophy of thyroid (acquired): Secondary | ICD-10-CM | POA: Diagnosis not present

## 2021-07-07 DIAGNOSIS — I2510 Atherosclerotic heart disease of native coronary artery without angina pectoris: Secondary | ICD-10-CM

## 2021-07-07 DIAGNOSIS — E669 Obesity, unspecified: Secondary | ICD-10-CM

## 2021-07-07 DIAGNOSIS — R5382 Chronic fatigue, unspecified: Secondary | ICD-10-CM

## 2021-07-07 DIAGNOSIS — E1169 Type 2 diabetes mellitus with other specified complication: Secondary | ICD-10-CM | POA: Diagnosis not present

## 2021-07-07 DIAGNOSIS — I2583 Coronary atherosclerosis due to lipid rich plaque: Secondary | ICD-10-CM

## 2021-07-07 MED ORDER — PROAIR RESPICLICK 108 (90 BASE) MCG/ACT IN AEPB: 1.0000 | INHALATION_SPRAY | Freq: Four times a day (QID) | RESPIRATORY_TRACT | 5 refills | Status: DC | PRN

## 2021-07-07 MED ORDER — RYBELSUS 7 MG PO TABS: 1.0000 | ORAL_TABLET | Freq: Every day | ORAL | 3 refills | Status: DC

## 2021-07-07 NOTE — Assessment & Plan Note (Addendum)
Continue on Simvastatin 

## 2021-07-07 NOTE — Assessment & Plan Note (Addendum)
Cont on Levothroid 

## 2021-07-07 NOTE — Assessment & Plan Note (Signed)
Better  

## 2021-07-07 NOTE — Assessment & Plan Note (Signed)
Increase Rybelsus to 7 mg ?

## 2021-07-07 NOTE — Progress Notes (Signed)
? ?Subjective:  ?Patient ID: [name] [name], male    DOB: 1960-08-22  Age: 61 y.o. MRN: [id number] ? ?CC: No chief complaint on file. ? ? ?HPI ?[name] [name] presents for DM, wt gain, dyslipidemia ? ?Outpatient Medications Prior to Visit  ?Medication Sig Dispense Refill  ? Armodafinil 150 MG tablet TAKE 1 TABLET BY MOUTH EVERY DAY IN THE MORNING 30 tablet 5  ? cholecalciferol (VITAMIN D) 1000 UNITS tablet Take 1,000 Units by mouth daily.    ? levothyroxine (SYNTHROID) 150 MCG tablet TAKE 1 TABLET BY MOUTH EVERY DAY 90 tablet 3  ? metFORMIN (GLUCOPHAGE) 500 MG tablet TAKE 1 TABLET BY MOUTH EVERY DAY WITH BREAKFAST 90 tablet 3  ? montelukast (SINGULAIR) 10 MG tablet TAKE 1 TABLET BY MOUTH EVERY DAY 90 tablet 3  ? omeprazole (PRILOSEC) 40 MG capsule TAKE 1 CAPSULE BY MOUTH EVERY DAY 90 capsule 2  ? simvastatin (ZOCOR) 40 MG tablet TAKE 1 TABLET BY MOUTH EVERY DAY 90 tablet 2  ? Albuterol Sulfate (PROAIR RESPICLICK) 108 (90 Base) MCG/ACT AEPB Inhale 1-2 puffs into the lungs 4 (four) times daily as needed. 1 each 5  ? Semaglutide (RYBELSUS) 3 MG TABS Take 3 mg by mouth daily. 30 tablet 3  ? ?No facility-administered medications prior to visit.  ? ? ?ROS: ?Review of Systems  ?Constitutional:  Negative for appetite change, fatigue and unexpected weight change.  ?HENT:  Negative for congestion, nosebleeds, sneezing, sore throat and trouble swallowing.   ?Eyes:  Negative for itching and visual disturbance.  ?Respiratory:  Negative for cough.   ?Cardiovascular:  Negative for chest pain, palpitations and leg swelling.  ?Gastrointestinal:  Negative for abdominal distention, blood in stool, diarrhea and nausea.  ?Genitourinary:  Negative for frequency and hematuria.  ?Musculoskeletal:  Negative for back pain, gait problem, joint swelling and neck pain.  ?Skin:  Negative for rash.  ?Neurological:  Negative for dizziness, tremors, speech difficulty and weakness.  ?Psychiatric/Behavioral:  Negative for agitation, dysphoric mood  and sleep disturbance. The patient is not nervous/anxious.   ? ?Objective:  ?BP 118/80 (BP Location: Left Arm, Patient Position: Sitting, Cuff Size: Large)   Pulse 84   Temp 97.8 ?F (36.6 ?C) (Oral)   Ht 6\' 2"  (1.88 m)   Wt 215 lb (97.5 kg)   SpO2 99%   BMI 27.60 kg/m?  ? ?BP Readings from Last 3 Encounters:  ?07/07/21 118/80  ?04/07/21 112/78  ?12/06/20 120/82  ? ? ?Wt Readings from Last 3 Encounters:  ?07/07/21 215 lb (97.5 kg)  ?04/07/21 222 lb 3.2 oz (100.8 kg)  ?12/06/20 216 lb 12.8 oz (98.3 kg)  ? ? ?Physical Exam ?Constitutional:   ?   General: He is not in acute distress. ?   Appearance: He is well-developed.  ?   Comments: NAD  ?Eyes:  ?   Conjunctiva/sclera: Conjunctivae normal.  ?   Pupils: Pupils are equal, round, and reactive to light.  ?Neck:  ?   Thyroid: No thyromegaly.  ?   Vascular: No JVD.  ?Cardiovascular:  ?   Rate and Rhythm: Normal rate and regular rhythm.  ?   Heart sounds: Normal heart sounds. No murmur heard. ?  No friction rub. No gallop.  ?Pulmonary:  ?   Effort: Pulmonary effort is normal. No respiratory distress.  ?   Breath sounds: Normal breath sounds. No wheezing or rales.  ?Chest:  ?   Chest wall: No tenderness.  ?Abdominal:  ?   General: Bowel sounds are  normal. There is no distension.  ?   Palpations: Abdomen is soft. There is no mass.  ?   Tenderness: There is no abdominal tenderness. There is no guarding or rebound.  ?Musculoskeletal:     ?   General: No tenderness. Normal range of motion.  ?   Cervical back: Normal range of motion.  ?Lymphadenopathy:  ?   Cervical: No cervical adenopathy.  ?Skin: ?   General: Skin is warm and dry.  ?   Findings: No rash.  ?Neurological:  ?   Mental Status: He is alert and oriented to person, place, and time.  ?   Cranial Nerves: No cranial nerve deficit.  ?   Motor: No abnormal muscle tone.  ?   Coordination: Coordination normal.  ?   Gait: Gait normal.  ?   Deep Tendon Reflexes: Reflexes are normal and symmetric.  ?Psychiatric:     ?    Behavior: Behavior normal.     ?   Thought Content: Thought content normal.     ?   Judgment: Judgment normal.  ? ? ?Lab Results  ?Component Value Date  ? WBC 7.2 06/04/2020  ? HGB 14.8 06/04/2020  ? HCT 43.5 06/04/2020  ? PLT 233.0 06/04/2020  ? GLUCOSE 94 07/04/2021  ? CHOL 185 12/01/2020  ? TRIG 145.0 12/01/2020  ? HDL 41.50 12/01/2020  ? LDLDIRECT 135.6 01/25/2012  ? LDLCALC 114 (H) 12/01/2020  ? ALT 27 07/04/2021  ? AST 20 07/04/2021  ? NA 137 07/04/2021  ? K 3.8 07/04/2021  ? CL 102 07/04/2021  ? CREATININE 1.00 07/04/2021  ? BUN 12 07/04/2021  ? CO2 28 07/04/2021  ? TSH 0.56 04/05/2021  ? PSA 0.67 06/04/2020  ? INR 0.9 RATIO 01/28/2007  ? HGBA1C 6.3 07/04/2021  ? ? ?CT CARDIAC SCORING ? ?Addendum Date: 02/07/2019   ?ADDENDUM REPORT: 02/07/2019 07:50 EXAM: OVER-READ INTERPRETATION  CT CHEST The following report is an over-read performed by radiologist Dr. Maryelizabeth RowanStewart [facility] on 02/07/2019. This over-read does not include interpretation of cardiac or coronary anatomy or pathology. The calcium score interpretation by the cardiologist is attached. COMPARISON:  None. FINDINGS: Limited view of the lung parenchyma demonstrates paraseptal emphysema in the LEFT lung. Airways are normal. Limited view of the mediastinum demonstrates no adenopathy. Esophagus normal. Limited view of the upper abdomen unremarkable. Limited view of the skeleton and chest wall is unremarkable. IMPRESSION: 1. Paraseptal emphysema. 2. No acute findings. Electronically Signed   By: Genevive BiStewart  Edmunds M.D.   On: 02/07/2019 07:50  ? ?Result Date: 02/07/2019 ?CLINICAL DATA:  Risk stratification EXAM: Coronary Calcium Score MEDICATIONS: None TECHNIQUE: The patient was scanned on a [employer] scanner. Axial non-contrast 3 mm slices were carried out through the heart. The data set was analyzed on a dedicated work station and scored using the Agatson method. FINDINGS: Non-cardiac: See separate report from [facility].  Ascending Aorta: Normal size, trivial calcifications. Pericardium: Normal. Coronary arteries: Normal size. IMPRESSION: Coronary calcium score of 184. This was 7185 percentile for age and sex matched control. Electronically Signed: By: Tobias AlexanderKatarina  Nelson On: 02/06/2019 19:00  ? ? ?Assessment & Plan:  ? ?Problem List Items Addressed This Visit   ? ? Coronary atherosclerosis  ?  Continue on Simvastatin ?  ?  ? Relevant Orders  ? Comprehensive metabolic panel  ? Diabetes mellitus type 2 in obese [facility](HCC)  ?   Increase Rybelsus to 7 mg ?  ?  ? Relevant Medications  ?  Semaglutide (RYBELSUS) 7 MG TABS  ? Other Relevant Orders  ? Hemoglobin A1c  ? Comprehensive metabolic panel  ? Comprehensive metabolic panel  ? Fatigue  ?  Better  ?  ?  ? Hypothyroidism  ?  Cont on Levothroid ?  ?  ?  ? ? ?Meds ordered this encounter  ?Medications  ? Semaglutide (RYBELSUS) 7 MG TABS  ?  Sig: Take 1 tablet by mouth daily. Take 30 min ac  ?  Dispense:  30 tablet  ?  Refill:  3  ? Albuterol Sulfate (PROAIR RESPICLICK) 108 (90 Base) MCG/ACT AEPB  ?  Sig: Inhale 1-2 puffs into the lungs 4 (four) times daily as needed.  ?  Dispense:  1 each  ?  Refill:  5  ?  ? ? ?Follow-up: Return in about 3 months (around 10/07/2021) for a follow-up visit. ? ?Sonda Primes, MD ?

## 2021-07-08 ENCOUNTER — Encounter: Payer: Self-pay | Admitting: Gastroenterology

## 2021-08-08 ENCOUNTER — Other Ambulatory Visit: Payer: Self-pay | Admitting: Internal Medicine

## 2021-08-16 ENCOUNTER — Encounter: Payer: Self-pay | Admitting: Gastroenterology

## 2021-08-16 ENCOUNTER — Ambulatory Visit: Payer: Federal, State, Local not specified - PPO | Admitting: Gastroenterology

## 2021-08-16 VITALS — BP 118/78 | HR 80 | Ht 74.0 in | Wt 210.2 lb

## 2021-08-16 DIAGNOSIS — K219 Gastro-esophageal reflux disease without esophagitis: Secondary | ICD-10-CM | POA: Diagnosis not present

## 2021-08-16 DIAGNOSIS — R1310 Dysphagia, unspecified: Secondary | ICD-10-CM | POA: Diagnosis not present

## 2021-08-16 NOTE — Progress Notes (Signed)
Review of pertinent gastrointestinal problems: ?1.  Mild esophagitis causing dysphagia.  EGD 2012 Dr. Christella Hartigan found very mild acid related esophagitis.  Test was done for mild intermittent dysphagia.  He was recommended to remain on proton pump inhibitor once daily.  Mild nonspecific gastritis was biopsied was negative for H. pylori. ?2.  Routine risk for colon cancer.  Cologuard colon cancer screening test ordered in 2022 by his PCP and it was normal. ? ? ?HPI: ?This is a very pleasant 61 year old man who was referred to me by Plotnikov, Georgina Quint, MD  to evaluate chronic GERD, intermittent dysphagia.   ? ?He has been on proton pump inhibitors for many years.  He takes omeprazole 1 pill once daily generally half an hour after his dinner meal.  If he stops eating for a day or 2 then he has significant pyrosis. ? ?He has difficulty with intermittent slightly progressive solid food only dysphagia for the past several years.  This will happen about twice per month.  This can happen with pills that can happen with tater tots and can happen with rice. ? ?He has intentionally lost 11 pounds in the past 7 months, trying to eat less because of his diabetes. ? ?No overt GI bleeding ? ? ?Review of systems: ?Pertinent positive and negative review of systems were noted in the above HPI section. All other review negative. ? ? ?Past Medical History:  ?Diagnosis Date  ? ACL tear   ? Bilateral- Dr. Wyline Mood  ? Alcohol abuse   ? Asthma   ? Depression   ? Hyperlipidemia   ? Hypothyroidism   ? Kidney stone   ? hx  ? OA (osteoarthritis)   ? ? ?Past Surgical History:  ?Procedure Laterality Date  ? FINGER SURGERY    ? NASAL SINUS SURGERY  1988/2008  ? ? ?Current Outpatient Medications  ?Medication Instructions  ? Albuterol Sulfate (PROAIR RESPICLICK) 108 (90 Base) MCG/ACT AEPB 1-2 puffs, Inhalation, 4 times daily PRN  ? Armodafinil 150 MG tablet TAKE 1 TABLET BY MOUTH EVERY DAY IN THE MORNING  ? cholecalciferol (VITAMIN D) 1,000 Units,  Oral, Daily,    ? levothyroxine (SYNTHROID) 150 MCG tablet TAKE 1 TABLET BY MOUTH EVERY DAY  ? metFORMIN (GLUCOPHAGE) 500 MG tablet TAKE 1 TABLET BY MOUTH EVERY DAY WITH BREAKFAST  ? montelukast (SINGULAIR) 10 MG tablet TAKE 1 TABLET BY MOUTH EVERY DAY  ? omeprazole (PRILOSEC) 40 MG capsule TAKE 1 CAPSULE BY MOUTH EVERY DAY  ? Semaglutide (RYBELSUS) 7 MG TABS 1 tablet, Oral, Daily, Take 30 min ac  ? simvastatin (ZOCOR) 40 MG tablet TAKE 1 TABLET BY MOUTH EVERY DAY  ? ? ?Allergies as of 08/16/2021  ? (No Known Allergies)  ? ? ?Family History  ?Problem Relation Age of Onset  ? Heart disease Mother   ?     valve surgery  ? ? ?Social History  ? ?Socioeconomic History  ? Marital status: Married  ?  Spouse name: Not on file  ? Number of children: Not on file  ? Years of education: Not on file  ? Highest education level: Not on file  ?Occupational History  ? Occupation: NDE Dept. Manager  ?  Employer: [employer]  ?Tobacco Use  ? Smoking status: Every Day  ?  Packs/day: 0.50  ?  Types: Cigarettes  ? Smokeless tobacco: Never  ?Substance and Sexual Activity  ? Alcohol use: No  ? Drug use: No  ? Sexual activity: Not Currently  ?Other Topics Concern  ?  Not on file  ?Social History Narrative  ? Not on file  ? ?Social Determinants of Health  ? ?Financial Resource Strain: Not on file  ?Food Insecurity: Not on file  ?Transportation Needs: Not on file  ?Physical Activity: Not on file  ?Stress: Not on file  ?Social Connections: Not on file  ?Intimate Partner Violence: Not on file  ? ? ? ?Physical Exam: ?BP 118/78   Pulse 80   Ht 6\' 2"  (1.88 m)   Wt 210 lb 3.2 oz (95.3 kg)   BMI 26.99 kg/m?  ?Constitutional: generally well-appearing ?Psychiatric: alert and oriented x3 ?Eyes: extraocular movements intact ?Mouth: oral pharynx moist, no lesions ?Neck: supple no lymphadenopathy ?Cardiovascular: heart regular rate and rhythm ?Lungs: clear to auscultation bilaterally ?Abdomen: soft, nontender, nondistended, no obvious ascites, no peritoneal  signs, normal bowel sounds ?Extremities: no lower extremity edema bilaterally ?Skin: no lesions on visible extremities ? ? ?Assessment and plan: ?61 y.o. male with chronic GERD, intermittent slightly progressive solid food only dysphagia ? ?First I recommended that he change the way he is taking his proton pump inhibitor so that it is shortly prior to a meal rather than after his dinner meal.  I think this will possibly go a long way to help treating his GERD and possibly also his dysphagia. ? ?Second I recommended that we perform EGD for him in the next several weeks to exclude other potential causes of dysphagia such as neoplasm which I think is unlikely. ? ?I see no reason for any further blood tests or imaging studies prior to then. ? ?Please see the "Patient Instructions" section for addition details about the plan. ? ? ?48, MD ?[facility] ?08/16/2021, 10:30 AM ? ?Cc: Plotnikov, 08/18/2021, MD ? ?Total time on date of encounter was 47  minutes (this included time spent preparing to see the patient reviewing records; obtaining and/or reviewing separately obtained history; performing a medically appropriate exam and/or evaluation; counseling and educating the patient and family if present; ordering medications, tests or procedures if applicable; and documenting clinical information in the health record). ? ? ?

## 2021-08-16 NOTE — Patient Instructions (Signed)
If you are age 59 or younger, your body mass index should be between 19-25. Your Body mass index is 26.99 kg/m?Marland Kitchen If this is out of the aformentioned range listed, please consider follow up with your Primary Care Provider.  ?________________________________________________________ ? ?The [facility] providers would like to encourage you to use [facility] to communicate with providers for non-urgent requests or questions.  Due to long hold times on the telephone, sending your provider a message by [facility] may be a faster and more efficient way to get a response.  Please allow 48 business hours for a response.  Please remember that this is for non-urgent requests.  ?_______________________________________________________ ? ?Please take Omeprazole 40mg  one capsule shortly before breakfast meal each day. ? ?You have been scheduled for an endoscopy. Please follow written instructions given to you at your visit today. ?If you use inhalers (even only as needed), please bring them with you on the day of your procedure. ? ?Due to recent changes in healthcare laws, you may see the results of your imaging and laboratory studies on MyChart before your provider has had a chance to review them.  We understand that in some cases there may be results that are confusing or concerning to you. Not all laboratory results come back in the same time frame and the provider may be waiting for multiple results in order to interpret others.  Please give [place] 48 hours in order for your provider to thoroughly review all the results before contacting the office for clarification of your results.  ? ?Thank you for entrusting me with your care and choosing [facility]. ? ?Dr Ardis Hughs ? ? ? ?

## 2021-09-20 ENCOUNTER — Encounter: Payer: Self-pay | Admitting: Gastroenterology

## 2021-09-27 ENCOUNTER — Encounter: Payer: Federal, State, Local not specified - PPO | Admitting: Gastroenterology

## 2021-10-03 ENCOUNTER — Other Ambulatory Visit: Payer: Self-pay | Admitting: Internal Medicine

## 2021-10-05 DIAGNOSIS — D1801 Hemangioma of skin and subcutaneous tissue: Secondary | ICD-10-CM | POA: Diagnosis not present

## 2021-10-05 DIAGNOSIS — L812 Freckles: Secondary | ICD-10-CM | POA: Diagnosis not present

## 2021-10-05 DIAGNOSIS — D3617 Benign neoplasm of peripheral nerves and autonomic nervous system of trunk, unspecified: Secondary | ICD-10-CM | POA: Diagnosis not present

## 2021-10-05 DIAGNOSIS — L570 Actinic keratosis: Secondary | ICD-10-CM | POA: Diagnosis not present

## 2021-10-05 DIAGNOSIS — L821 Other seborrheic keratosis: Secondary | ICD-10-CM | POA: Diagnosis not present

## 2021-10-06 ENCOUNTER — Other Ambulatory Visit (INDEPENDENT_AMBULATORY_CARE_PROVIDER_SITE_OTHER): Payer: Federal, State, Local not specified - PPO

## 2021-10-06 DIAGNOSIS — E669 Obesity, unspecified: Secondary | ICD-10-CM | POA: Diagnosis not present

## 2021-10-06 DIAGNOSIS — I2510 Atherosclerotic heart disease of native coronary artery without angina pectoris: Secondary | ICD-10-CM | POA: Diagnosis not present

## 2021-10-06 DIAGNOSIS — I2583 Coronary atherosclerosis due to lipid rich plaque: Secondary | ICD-10-CM | POA: Diagnosis not present

## 2021-10-06 DIAGNOSIS — E1169 Type 2 diabetes mellitus with other specified complication: Secondary | ICD-10-CM | POA: Diagnosis not present

## 2021-10-06 LAB — HEMOGLOBIN A1C: Hgb A1c MFr Bld: 6.1 % (ref 4.6–6.5)

## 2021-10-06 LAB — COMPREHENSIVE METABOLIC PANEL
ALT: 20 U/L (ref 0–53)
AST: 16 U/L (ref 0–37)
Albumin: 4.1 g/dL (ref 3.5–5.2)
Alkaline Phosphatase: 59 U/L (ref 39–117)
BUN: 9 mg/dL (ref 6–23)
CO2: 29 mEq/L (ref 19–32)
Calcium: 9.4 mg/dL (ref 8.4–10.5)
Chloride: 102 mEq/L (ref 96–112)
Creatinine, Ser: 0.93 mg/dL (ref 0.40–1.50)
GFR: 89.97 mL/min (ref >60.00)
Glucose, Bld: 95 mg/dL (ref 70–99)
Potassium: 4.2 mEq/L (ref 3.5–5.1)
Sodium: 138 mEq/L (ref 135–145)
Total Bilirubin: 0.4 mg/dL (ref 0.2–1.2)
Total Protein: 6.6 g/dL (ref 6.0–8.3)

## 2021-10-10 ENCOUNTER — Encounter: Payer: Self-pay | Admitting: Internal Medicine

## 2021-10-10 ENCOUNTER — Ambulatory Visit: Payer: Federal, State, Local not specified - PPO | Admitting: Internal Medicine

## 2021-10-10 DIAGNOSIS — R5382 Chronic fatigue, unspecified: Secondary | ICD-10-CM

## 2021-10-10 DIAGNOSIS — G4733 Obstructive sleep apnea (adult) (pediatric): Secondary | ICD-10-CM | POA: Diagnosis not present

## 2021-10-10 DIAGNOSIS — E1169 Type 2 diabetes mellitus with other specified complication: Secondary | ICD-10-CM | POA: Diagnosis not present

## 2021-10-10 DIAGNOSIS — E669 Obesity, unspecified: Secondary | ICD-10-CM | POA: Diagnosis not present

## 2021-10-10 DIAGNOSIS — Z9989 Dependence on other enabling machines and devices: Secondary | ICD-10-CM

## 2021-10-10 MED ORDER — RYBELSUS 14 MG PO TABS: 14.0000 mg | ORAL_TABLET | Freq: Every day | ORAL | 5 refills | Status: DC

## 2021-10-10 NOTE — Progress Notes (Signed)
Subjective:  Patient ID: PHARELL JAKOB, male    DOB: 01-15-1961  Age: 61 y.o. MRN: [id number]  CC: No chief complaint on file.   HPI DEVONTAYE EDGHILL presents for DM, obesity, hypothyroidism. Was working 80 h/wk  Outpatient Medications Prior to Visit  Medication Sig Dispense Refill   Albuterol Sulfate (PROAIR RESPICLICK) 123XX123 (90 Base) MCG/ACT AEPB Inhale 1-2 puffs into the lungs 4 (four) times daily as needed. 1 each 5   ANORO ELLIPTA 62.5-25 MCG/ACT AEPB      Armodafinil 150 MG tablet TAKE 1 TABLET BY MOUTH EVERY DAY IN THE MORNING 30 tablet 5   BREZTRI AEROSPHERE 160-9-4.8 MCG/ACT AERO SMARTSIG:1 Puff(s) Via Inhaler     cholecalciferol (VITAMIN D) 1000 UNITS tablet Take 1,000 Units by mouth daily.     EFFEXOR XR 75 MG 24 hr capsule      levothyroxine (SYNTHROID) 137 MCG tablet Take by mouth.     meloxicam (MOBIC) 15 MG tablet      metFORMIN (GLUCOPHAGE) 500 MG tablet TAKE 1 TABLET BY MOUTH EVERY DAY WITH BREAKFAST 90 tablet 3   montelukast (SINGULAIR) 10 MG tablet TAKE 1 TABLET BY MOUTH EVERY DAY 90 tablet 3   omeprazole (PRILOSEC) 40 MG capsule TAKE 1 CAPSULE BY MOUTH EVERY DAY 90 capsule 1   Semaglutide (RYBELSUS) 7 MG TABS Take 1 tablet by mouth daily. Take 30 min ac 30 tablet 3   simvastatin (ZOCOR) 40 MG tablet TAKE 1 TABLET BY MOUTH EVERY DAY 90 tablet 1   levothyroxine (SYNTHROID) 150 MCG tablet TAKE 1 TABLET BY MOUTH EVERY DAY 90 tablet 3   No facility-administered medications prior to visit.    ROS: Review of Systems  Constitutional:  Negative for appetite change, fatigue and unexpected weight change.  HENT:  Negative for congestion, nosebleeds, sneezing, sore throat and trouble swallowing.   Eyes:  Negative for itching and visual disturbance.  Respiratory:  Negative for cough.   Cardiovascular:  Negative for chest pain, palpitations and leg swelling.  Gastrointestinal:  Negative for abdominal distention, blood in stool, diarrhea and nausea.  Genitourinary:   Negative for frequency and hematuria.  Musculoskeletal:  Negative for back pain, gait problem, joint swelling and neck pain.  Skin:  Negative for rash.  Neurological:  Negative for dizziness, tremors, speech difficulty and weakness.  Psychiatric/Behavioral:  Negative for agitation, dysphoric mood and sleep disturbance. The patient is not nervous/anxious.     Objective:  BP 118/80 (BP Location: Left Arm, Patient Position: Sitting, Cuff Size: Large)   Pulse 79   Temp 97.8 F (36.6 C) (Oral)   Ht 6\' 2"  (1.88 m)   Wt 204 lb (92.5 kg)   SpO2 95%   BMI 26.19 kg/m   BP Readings from Last 3 Encounters:  10/10/21 118/80  08/16/21 118/78  07/07/21 118/80    Wt Readings from Last 3 Encounters:  10/10/21 204 lb (92.5 kg)  08/16/21 210 lb 3.2 oz (95.3 kg)  07/07/21 215 lb (97.5 kg)    Physical Exam Constitutional:      General: He is not in acute distress.    Appearance: He is well-developed.     Comments: NAD  Eyes:     Conjunctiva/sclera: Conjunctivae normal.     Pupils: Pupils are equal, round, and reactive to light.  Neck:     Thyroid: No thyromegaly.     Vascular: No JVD.  Cardiovascular:     Rate and Rhythm: Normal rate and regular rhythm.  Heart sounds: Normal heart sounds. No murmur heard.    No friction rub. No gallop.  Pulmonary:     Effort: Pulmonary effort is normal. No respiratory distress.     Breath sounds: Normal breath sounds. No wheezing or rales.  Chest:     Chest wall: No tenderness.  Abdominal:     General: Bowel sounds are normal. There is no distension.     Palpations: Abdomen is soft. There is no mass.     Tenderness: There is no abdominal tenderness. There is no guarding or rebound.  Musculoskeletal:        General: No tenderness. Normal range of motion.     Cervical back: Normal range of motion.  Lymphadenopathy:     Cervical: No cervical adenopathy.  Skin:    General: Skin is warm and dry.     Findings: No rash.  Neurological:     Mental  Status: He is alert and oriented to person, place, and time.     Cranial Nerves: No cranial nerve deficit.     Motor: No abnormal muscle tone.     Coordination: Coordination normal.     Gait: Gait normal.     Deep Tendon Reflexes: Reflexes are normal and symmetric.  Psychiatric:        Behavior: Behavior normal.        Thought Content: Thought content normal.        Judgment: Judgment normal.   Stiff back  Lab Results  Component Value Date   WBC 7.2 06/04/2020   HGB 14.8 06/04/2020   HCT 43.5 06/04/2020   PLT 233.0 06/04/2020   GLUCOSE 95 10/06/2021   CHOL 185 12/01/2020   TRIG 145.0 12/01/2020   HDL 41.50 12/01/2020   LDLDIRECT 135.6 01/25/2012   LDLCALC 114 (H) 12/01/2020   ALT 20 10/06/2021   AST 16 10/06/2021   NA 138 10/06/2021   K 4.2 10/06/2021   CL 102 10/06/2021   CREATININE 0.93 10/06/2021   BUN 9 10/06/2021   CO2 29 10/06/2021   TSH 0.56 04/05/2021   PSA 0.67 06/04/2020   INR 0.9 RATIO 01/28/2007   HGBA1C 6.1 10/06/2021    CT CARDIAC SCORING  Addendum Date: 02/07/2019   ADDENDUM REPORT: 02/07/2019 07:50 EXAM: OVER-READ INTERPRETATION  CT CHEST The following report is an over-read performed by radiologist Dr. Alvino Blood [facility] on 02/07/2019. This over-read does not include interpretation of cardiac or coronary anatomy or pathology. The calcium score interpretation by the cardiologist is attached. COMPARISON:  None. FINDINGS: Limited view of the lung parenchyma demonstrates paraseptal emphysema in the LEFT lung. Airways are normal. Limited view of the mediastinum demonstrates no adenopathy. Esophagus normal. Limited view of the upper abdomen unremarkable. Limited view of the skeleton and chest wall is unremarkable. IMPRESSION: 1. Paraseptal emphysema. 2. No acute findings. Electronically Signed   By: Suzy Bouchard M.D.   On: 02/07/2019 07:50   Result Date: 02/07/2019 CLINICAL DATA:  Risk stratification EXAM: Coronary Calcium Score  MEDICATIONS: None TECHNIQUE: The patient was scanned on a [employer]. Axial non-contrast 3 mm slices were carried out through the heart. The data set was analyzed on a dedicated work station and scored using the [place]. FINDINGS: Non-cardiac: See separate report from [facility]. Ascending Aorta: Normal size, trivial calcifications. Pericardium: Normal. Coronary arteries: Normal size. IMPRESSION: Coronary calcium score of 184. This was 13 percentile for age and sex matched control. Electronically Signed: By: Ena Dawley On: 02/06/2019 19:00  Assessment & Plan:   Problem List Items Addressed This Visit     Diabetes mellitus type 2 in obese ([place])     Increase Rybelsus to 14 mg. On Metformin      Relevant Medications   Semaglutide (RYBELSUS) 14 MG TABS   Other Relevant Orders   Hemoglobin A1c   Comprehensive metabolic panel   Fatigue    Shift work disorder. Cont on Nuvigyl       OSA on CPAP    On CPAP  Cont on Nuvigyl          Meds ordered this encounter  Medications   Semaglutide (RYBELSUS) 14 MG TABS    Sig: Take 1 tablet (14 mg total) by mouth daily.    Dispense:  30 tablet    Refill:  5      Follow-up: Return in about 4 months (around 02/09/2022) for a follow-up visit.  Walker Kehr, MD

## 2021-10-10 NOTE — Assessment & Plan Note (Signed)
Shift work disorder. Cont on [employer]

## 2021-10-10 NOTE — Assessment & Plan Note (Signed)
On CPAP  Cont on [employer]

## 2021-10-10 NOTE — Assessment & Plan Note (Signed)
Increase Rybelsus to 14 mg. On Metformin 

## 2021-11-02 ENCOUNTER — Other Ambulatory Visit: Payer: Self-pay | Admitting: Internal Medicine

## 2022-02-01 ENCOUNTER — Other Ambulatory Visit: Payer: Self-pay | Admitting: *Deleted

## 2022-02-01 MED ORDER — SIMVASTATIN 40 MG PO TABS: 40.0000 mg | ORAL_TABLET | Freq: Every day | ORAL | 1 refills | Status: DC

## 2022-02-07 ENCOUNTER — Other Ambulatory Visit (INDEPENDENT_AMBULATORY_CARE_PROVIDER_SITE_OTHER): Payer: Federal, State, Local not specified - PPO

## 2022-02-07 DIAGNOSIS — E1169 Type 2 diabetes mellitus with other specified complication: Secondary | ICD-10-CM

## 2022-02-07 DIAGNOSIS — E669 Obesity, unspecified: Secondary | ICD-10-CM | POA: Diagnosis not present

## 2022-02-07 LAB — COMPREHENSIVE METABOLIC PANEL
ALT: 21 U/L (ref 0–53)
AST: 18 U/L (ref 0–37)
Albumin: 4.2 g/dL (ref 3.5–5.2)
Alkaline Phosphatase: 58 U/L (ref 39–117)
BUN: 9 mg/dL (ref 6–23)
CO2: 28 mEq/L (ref 19–32)
Calcium: 9.6 mg/dL (ref 8.4–10.5)
Chloride: 101 mEq/L (ref 96–112)
Creatinine, Ser: 0.87 mg/dL (ref 0.40–1.50)
GFR: 94.32 mL/min (ref >60.00)
Glucose, Bld: 98 mg/dL (ref 70–99)
Potassium: 3.8 mEq/L (ref 3.5–5.1)
Sodium: 137 mEq/L (ref 135–145)
Total Bilirubin: 0.5 mg/dL (ref 0.2–1.2)
Total Protein: 7 g/dL (ref 6.0–8.3)

## 2022-02-07 LAB — HEMOGLOBIN A1C: Hgb A1c MFr Bld: 6.2 % (ref 4.6–6.5)

## 2022-02-09 ENCOUNTER — Ambulatory Visit: Payer: Federal, State, Local not specified - PPO | Admitting: Internal Medicine

## 2022-02-09 ENCOUNTER — Encounter: Payer: Self-pay | Admitting: Internal Medicine

## 2022-02-09 VITALS — BP 118/72 | HR 89 | Temp 97.8°F | Ht 74.0 in | Wt 197.6 lb

## 2022-02-09 DIAGNOSIS — Z0000 Encounter for general adult medical examination without abnormal findings: Secondary | ICD-10-CM

## 2022-02-09 DIAGNOSIS — G8929 Other chronic pain: Secondary | ICD-10-CM

## 2022-02-09 DIAGNOSIS — E034 Atrophy of thyroid (acquired): Secondary | ICD-10-CM

## 2022-02-09 DIAGNOSIS — E1169 Type 2 diabetes mellitus with other specified complication: Secondary | ICD-10-CM | POA: Diagnosis not present

## 2022-02-09 DIAGNOSIS — R5382 Chronic fatigue, unspecified: Secondary | ICD-10-CM

## 2022-02-09 DIAGNOSIS — Z23 Encounter for immunization: Secondary | ICD-10-CM | POA: Diagnosis not present

## 2022-02-09 DIAGNOSIS — M1990 Unspecified osteoarthritis, unspecified site: Secondary | ICD-10-CM

## 2022-02-09 DIAGNOSIS — M545 Low back pain, unspecified: Secondary | ICD-10-CM

## 2022-02-09 DIAGNOSIS — E669 Obesity, unspecified: Secondary | ICD-10-CM

## 2022-02-09 MED ORDER — ARMODAFINIL 150 MG PO TABS: ORAL_TABLET | ORAL | 5 refills | Status: DC

## 2022-02-09 NOTE — Progress Notes (Signed)
Subjective:  Patient ID: [name] [name], male    DOB: 11-Aug-1960  Age: 61 y.o. MRN: [id number]  CC: Follow-up (4 month f/u- Flu shot)   HPI LEVITICUS GUPTE presents for DM, hypothyroidism, HTN  Outpatient Medications Prior to Visit  Medication Sig Dispense Refill   Albuterol Sulfate (PROAIR RESPICLICK) 123XX123 (90 Base) MCG/ACT AEPB Inhale 1-2 puffs into the lungs 4 (four) times daily as needed. 1 each 5   cholecalciferol (VITAMIN D) 1000 UNITS tablet Take 1,000 Units by mouth daily.     levothyroxine (SYNTHROID) 137 MCG tablet Take by mouth.     metFORMIN (GLUCOPHAGE) 500 MG tablet TAKE 1 TABLET BY MOUTH EVERY DAY WITH BREAKFAST 90 tablet 3   montelukast (SINGULAIR) 10 MG tablet TAKE 1 TABLET BY MOUTH EVERY DAY 90 tablet 3   omeprazole (PRILOSEC) 40 MG capsule TAKE 1 CAPSULE BY MOUTH EVERY DAY 90 capsule 1   Semaglutide (RYBELSUS) 14 MG TABS Take 1 tablet (14 mg total) by mouth daily. 30 tablet 5   simvastatin (ZOCOR) 40 MG tablet Take 1 tablet (40 mg total) by mouth daily. 90 tablet 1   Armodafinil 150 MG tablet TAKE 1 TABLET BY MOUTH EVERY DAY IN THE MORNING 30 tablet 5   Semaglutide (RYBELSUS) 7 MG TABS TAKE 1 TABLET BY MOUTH EVERY DAY 30 MINUTES BEFORE MEALS 30 tablet 5   ANORO ELLIPTA 62.5-25 MCG/ACT AEPB      BREZTRI AEROSPHERE 160-9-4.8 MCG/ACT AERO SMARTSIG:1 Puff(s) Via Inhaler     EFFEXOR XR 75 MG 24 hr capsule      meloxicam (MOBIC) 15 MG tablet      No facility-administered medications prior to visit.    ROS: Review of Systems  Constitutional:  Negative for appetite change, fatigue and unexpected weight change.  HENT:  Negative for congestion, nosebleeds, sneezing, sore throat and trouble swallowing.   Eyes:  Negative for itching and visual disturbance.  Respiratory:  Negative for cough.   Cardiovascular:  Negative for chest pain, palpitations and leg swelling.  Gastrointestinal:  Negative for abdominal distention, blood in stool, diarrhea and nausea.   Genitourinary:  Negative for frequency and hematuria.  Musculoskeletal:  Positive for arthralgias. Negative for back pain, gait problem, joint swelling and neck pain.  Skin:  Negative for rash.  Neurological:  Negative for dizziness, tremors, speech difficulty and weakness.  Psychiatric/Behavioral:  Positive for sleep disturbance. Negative for agitation, dysphoric mood and suicidal ideas. The patient is not nervous/anxious.     Objective:  BP 118/72 (BP Location: Left Arm)   Pulse 89   Temp 97.8 F (36.6 C) (Oral)   Ht 6\' 2"  (1.88 m)   Wt 197 lb 9.6 oz (89.6 kg)   SpO2 95%   BMI 25.37 kg/m   BP Readings from Last 3 Encounters:  02/09/22 118/72  10/10/21 118/80  08/16/21 118/78    Wt Readings from Last 3 Encounters:  02/09/22 197 lb 9.6 oz (89.6 kg)  10/10/21 204 lb (92.5 kg)  08/16/21 210 lb 3.2 oz (95.3 kg)    Physical Exam Constitutional:      General: He is not in acute distress.    Appearance: He is well-developed.     Comments: NAD  Eyes:     Conjunctiva/sclera: Conjunctivae normal.     Pupils: Pupils are equal, round, and reactive to light.  Neck:     Thyroid: No thyromegaly.     Vascular: No JVD.  Cardiovascular:     Rate and Rhythm: Normal  rate and regular rhythm.     Heart sounds: Normal heart sounds. No murmur heard.    No friction rub. No gallop.  Pulmonary:     Effort: Pulmonary effort is normal. No respiratory distress.     Breath sounds: Normal breath sounds. No wheezing or rales.  Chest:     Chest wall: No tenderness.  Abdominal:     General: Bowel sounds are normal. There is no distension.     Palpations: Abdomen is soft. There is no mass.     Tenderness: There is no abdominal tenderness. There is no guarding or rebound.  Musculoskeletal:        General: No tenderness. Normal range of motion.     Cervical back: Normal range of motion.  Lymphadenopathy:     Cervical: No cervical adenopathy.  Skin:    General: Skin is warm and dry.      Findings: No rash.  Neurological:     Mental Status: He is alert and oriented to person, place, and time.     Cranial Nerves: No cranial nerve deficit.     Motor: No abnormal muscle tone.     Coordination: Coordination normal.     Gait: Gait normal.     Deep Tendon Reflexes: Reflexes are normal and symmetric.  Psychiatric:        Behavior: Behavior normal.        Thought Content: Thought content normal.        Judgment: Judgment normal.     Lab Results  Component Value Date   WBC 7.2 06/04/2020   HGB 14.8 06/04/2020   HCT 43.5 06/04/2020   PLT 233.0 06/04/2020   GLUCOSE 98 02/07/2022   CHOL 185 12/01/2020   TRIG 145.0 12/01/2020   HDL 41.50 12/01/2020   LDLDIRECT 135.6 01/25/2012   LDLCALC 114 (H) 12/01/2020   ALT 21 02/07/2022   AST 18 02/07/2022   NA 137 02/07/2022   K 3.8 02/07/2022   CL 101 02/07/2022   CREATININE 0.87 02/07/2022   BUN 9 02/07/2022   CO2 28 02/07/2022   TSH 0.56 04/05/2021   PSA 0.67 06/04/2020   INR 0.9 RATIO 01/28/2007   HGBA1C 6.2 02/07/2022    CT CARDIAC SCORING  Addendum Date: 02/07/2019   ADDENDUM REPORT: 02/07/2019 07:50 EXAM: OVER-READ INTERPRETATION  CT CHEST The following report is an over-read performed by radiologist Dr. Alvino Blood [facility] on 02/07/2019. This over-read does not include interpretation of cardiac or coronary anatomy or pathology. The calcium score interpretation by the cardiologist is attached. COMPARISON:  None. FINDINGS: Limited view of the lung parenchyma demonstrates paraseptal emphysema in the LEFT lung. Airways are normal. Limited view of the mediastinum demonstrates no adenopathy. Esophagus normal. Limited view of the upper abdomen unremarkable. Limited view of the skeleton and chest wall is unremarkable. IMPRESSION: 1. Paraseptal emphysema. 2. No acute findings. Electronically Signed   By: Suzy Bouchard M.D.   On: 02/07/2019 07:50   Result Date: 02/07/2019 CLINICAL DATA:  Risk stratification  EXAM: Coronary Calcium Score MEDICATIONS: None TECHNIQUE: The patient was scanned on a [employer]. Axial non-contrast 3 mm slices were carried out through the heart. The data set was analyzed on a dedicated work station and scored using the [place]. FINDINGS: Non-cardiac: See separate report from [facility]. Ascending Aorta: Normal size, trivial calcifications. Pericardium: Normal. Coronary arteries: Normal size. IMPRESSION: Coronary calcium score of 184. This was 88 percentile for age and sex matched control. Electronically Signed:  By: Ena Dawley On: 02/06/2019 19:00    Assessment & Plan:   Problem List Items Addressed This Visit     Diabetes mellitus type 2 in obese ([place])     Increase Rybelsus to 14 mg. On Metformin      Relevant Orders   TSH   Urinalysis   CBC with Differential/Platelet   Lipid panel   PSA   Comprehensive metabolic panel   Hemoglobin A1c   Fatigue    Cont on Nuvigyl      Hypothyroidism    Cont on Levothroid      Relevant Orders   TSH   Urinalysis   CBC with Differential/Platelet   Lipid panel   PSA   Comprehensive metabolic panel   Hemoglobin A1c   LOW BACK PAIN    Doing well      Osteoarthritis    Blue-[employer]mu cream was recommended to use 2-3 times a day       Well adult exam - Primary   Relevant Orders   TSH   Urinalysis   CBC with Differential/Platelet   Lipid panel   PSA   Comprehensive metabolic panel   Hemoglobin A1c      Meds ordered this encounter  Medications   Armodafinil 150 MG tablet    Sig: TAKE 1 TABLET BY MOUTH EVERY DAY IN THE MORNING    Dispense:  30 tablet    Refill:  5      Follow-up: Return in about 4 months (around 06/12/2022) for Wellness Exam.  Walker Kehr, MD

## 2022-02-09 NOTE — Assessment & Plan Note (Signed)
Blue-Emu cream was recommended to use 2-3 times a day ? ?

## 2022-02-09 NOTE — Assessment & Plan Note (Signed)
Cont on [employer]

## 2022-02-09 NOTE — Assessment & Plan Note (Signed)
Doing well 

## 2022-02-09 NOTE — Assessment & Plan Note (Signed)
Cont on Levothroid 

## 2022-02-09 NOTE — Assessment & Plan Note (Signed)
Increase Rybelsus to 14 mg. On Metformin

## 2022-02-10 ENCOUNTER — Telehealth: Payer: Self-pay | Admitting: *Deleted

## 2022-02-10 NOTE — Telephone Encounter (Signed)
Rec'd [facility] fax for Armodafinil submitted w/ [id number]. Rec'd msg med was APPROVED. Effective 01/11/22 through 02/10/23.Marland KitchenJohny [name]

## 2022-02-11 ENCOUNTER — Other Ambulatory Visit: Payer: Self-pay | Admitting: Internal Medicine

## 2022-03-08 ENCOUNTER — Other Ambulatory Visit: Payer: Self-pay | Admitting: Internal Medicine

## 2022-04-17 DIAGNOSIS — L821 Other seborrheic keratosis: Secondary | ICD-10-CM | POA: Diagnosis not present

## 2022-04-17 DIAGNOSIS — D1801 Hemangioma of skin and subcutaneous tissue: Secondary | ICD-10-CM | POA: Diagnosis not present

## 2022-04-17 DIAGNOSIS — L812 Freckles: Secondary | ICD-10-CM | POA: Diagnosis not present

## 2022-04-17 DIAGNOSIS — L570 Actinic keratosis: Secondary | ICD-10-CM | POA: Diagnosis not present

## 2022-04-17 DIAGNOSIS — L578 Other skin changes due to chronic exposure to nonionizing radiation: Secondary | ICD-10-CM | POA: Diagnosis not present

## 2022-05-01 ENCOUNTER — Other Ambulatory Visit: Payer: Self-pay | Admitting: Internal Medicine

## 2022-06-12 ENCOUNTER — Other Ambulatory Visit (INDEPENDENT_AMBULATORY_CARE_PROVIDER_SITE_OTHER): Payer: Federal, State, Local not specified - PPO

## 2022-06-12 DIAGNOSIS — E034 Atrophy of thyroid (acquired): Secondary | ICD-10-CM

## 2022-06-12 DIAGNOSIS — E1169 Type 2 diabetes mellitus with other specified complication: Secondary | ICD-10-CM

## 2022-06-12 DIAGNOSIS — Z125 Encounter for screening for malignant neoplasm of prostate: Secondary | ICD-10-CM

## 2022-06-12 DIAGNOSIS — Z0000 Encounter for general adult medical examination without abnormal findings: Secondary | ICD-10-CM

## 2022-06-12 DIAGNOSIS — E669 Obesity, unspecified: Secondary | ICD-10-CM

## 2022-06-12 LAB — CBC WITH DIFFERENTIAL/PLATELET
Basophils Absolute: 0.1 10*3/uL (ref 0.0–0.1)
Basophils Relative: 1 % (ref 0.0–3.0)
Eosinophils Absolute: 0.1 10*3/uL (ref 0.0–0.7)
Eosinophils Relative: 1.9 % (ref 0.0–5.0)
HCT: 42.2 % (ref 39.0–52.0)
Hemoglobin: 14.4 g/dL (ref 13.0–17.0)
Lymphocytes Relative: 21.3 % (ref 12.0–46.0)
Lymphs Abs: 1.4 10*3/uL (ref 0.7–4.0)
MCHC: 34.1 g/dL (ref 30.0–36.0)
MCV: 90.7 fl (ref 78.0–100.0)
Monocytes Absolute: 0.6 10*3/uL (ref 0.1–1.0)
Monocytes Relative: 9.9 % (ref 3.0–12.0)
Neutro Abs: 4.2 10*3/uL (ref 1.4–7.7)
Neutrophils Relative %: 65.9 % (ref 43.0–77.0)
Platelets: 230 10*3/uL (ref 150.0–400.0)
RBC: 4.66 Mil/uL (ref 4.22–5.81)
RDW: 13.3 % (ref 11.5–15.5)
WBC: 6.4 10*3/uL (ref 4.0–10.5)

## 2022-06-12 LAB — LIPID PANEL
Cholesterol: 165 mg/dL (ref 0–200)
HDL: 43 mg/dL (ref >39.00)
LDL Cholesterol: 91 mg/dL (ref 0–99)
NonHDL: 121.79
Total CHOL/HDL Ratio: 4
Triglycerides: 155 mg/dL — ABNORMAL HIGH (ref 0.0–149.0)
VLDL: 31 mg/dL (ref 0.0–40.0)

## 2022-06-12 LAB — URINALYSIS
Bilirubin Urine: NEGATIVE
Ketones, ur: NEGATIVE
Leukocytes,Ua: NEGATIVE
Nitrite: NEGATIVE
Specific Gravity, Urine: 1.015 (ref 1.000–1.030)
Total Protein, Urine: NEGATIVE
Urine Glucose: NEGATIVE
Urobilinogen, UA: 0.2 (ref 0.0–1.0)
pH: 7 (ref 5.0–8.0)

## 2022-06-12 LAB — COMPREHENSIVE METABOLIC PANEL
ALT: 14 U/L (ref 0–53)
AST: 11 U/L (ref 0–37)
Albumin: 4 g/dL (ref 3.5–5.2)
Alkaline Phosphatase: 68 U/L (ref 39–117)
BUN: 14 mg/dL (ref 6–23)
CO2: 27 mEq/L (ref 19–32)
Calcium: 9.6 mg/dL (ref 8.4–10.5)
Chloride: 101 mEq/L (ref 96–112)
Creatinine, Ser: 0.98 mg/dL (ref 0.40–1.50)
GFR: 84.09 mL/min (ref >60.00)
Glucose, Bld: 92 mg/dL (ref 70–99)
Potassium: 4.5 mEq/L (ref 3.5–5.1)
Sodium: 138 mEq/L (ref 135–145)
Total Bilirubin: 0.3 mg/dL (ref 0.2–1.2)
Total Protein: 6.1 g/dL (ref 6.0–8.3)

## 2022-06-12 LAB — TSH: TSH: 0.04 u[IU]/mL — ABNORMAL LOW (ref 0.35–5.50)

## 2022-06-12 LAB — HEMOGLOBIN A1C: Hgb A1c MFr Bld: 6.2 % (ref 4.6–6.5)

## 2022-06-12 LAB — PSA: PSA: 0.63 ng/mL (ref 0.10–4.00)

## 2022-06-14 ENCOUNTER — Ambulatory Visit (INDEPENDENT_AMBULATORY_CARE_PROVIDER_SITE_OTHER): Payer: Federal, State, Local not specified - PPO | Admitting: Internal Medicine

## 2022-06-14 ENCOUNTER — Encounter: Payer: Self-pay | Admitting: Internal Medicine

## 2022-06-14 VITALS — BP 120/80 | HR 81 | Temp 97.7°F | Ht 74.0 in | Wt 197.0 lb

## 2022-06-14 DIAGNOSIS — E785 Hyperlipidemia, unspecified: Secondary | ICD-10-CM | POA: Diagnosis not present

## 2022-06-14 DIAGNOSIS — R5382 Chronic fatigue, unspecified: Secondary | ICD-10-CM | POA: Diagnosis not present

## 2022-06-14 DIAGNOSIS — I2583 Coronary atherosclerosis due to lipid rich plaque: Secondary | ICD-10-CM

## 2022-06-14 DIAGNOSIS — I2510 Atherosclerotic heart disease of native coronary artery without angina pectoris: Secondary | ICD-10-CM

## 2022-06-14 DIAGNOSIS — E1169 Type 2 diabetes mellitus with other specified complication: Secondary | ICD-10-CM | POA: Diagnosis not present

## 2022-06-14 DIAGNOSIS — E034 Atrophy of thyroid (acquired): Secondary | ICD-10-CM

## 2022-06-14 DIAGNOSIS — E669 Obesity, unspecified: Secondary | ICD-10-CM

## 2022-06-14 LAB — T3, FREE: T3, Free: 3.2 pg/mL (ref 2.3–4.2)

## 2022-06-14 LAB — T4, FREE: Free T4: 1.41 ng/dL (ref 0.60–1.60)

## 2022-06-14 LAB — TSH: TSH: 0.04 u[IU]/mL — ABNORMAL LOW (ref 0.35–5.50)

## 2022-06-14 MED ORDER — RYBELSUS 7 MG PO TABS: 1.0000 | ORAL_TABLET | Freq: Every day | ORAL | 3 refills | Status: DC

## 2022-06-14 NOTE — Assessment & Plan Note (Signed)
Continue on Simvastatin

## 2022-06-14 NOTE — Assessment & Plan Note (Signed)
Working 60-80 h/wk

## 2022-06-14 NOTE — Assessment & Plan Note (Signed)
Cont on Levothroid

## 2022-06-14 NOTE — Assessment & Plan Note (Signed)
Simvastatin-we will continue

## 2022-06-14 NOTE — Progress Notes (Signed)
Subjective:  Patient ID: [name] [name], male    DOB: 09-Apr-1961  Age: 61 y.o. MRN: [id number]  CC: No chief complaint on file.   HPI [name] [name] presents for hypothyroidism, DM, dyslipidemia C/o side effects w/Rybelsus 14 mg - constipation  Outpatient Medications Prior to Visit  Medication Sig Dispense Refill   Albuterol Sulfate (PROAIR RESPICLICK) 123XX123 (90 Base) MCG/ACT AEPB Inhale 1-2 puffs into the lungs 4 (four) times daily as needed. 1 each 5   Armodafinil 150 MG tablet TAKE 1 TABLET BY MOUTH EVERY DAY IN THE MORNING 30 tablet 5   cholecalciferol (VITAMIN D) 1000 UNITS tablet Take 1,000 Units by mouth daily.     levothyroxine (SYNTHROID) 137 MCG tablet Take by mouth.     metFORMIN (GLUCOPHAGE) 500 MG tablet TAKE 1 TABLET BY MOUTH EVERY DAY WITH BREAKFAST 90 tablet 3   montelukast (SINGULAIR) 10 MG tablet TAKE 1 TABLET BY MOUTH EVERY DAY 90 tablet 3   omeprazole (PRILOSEC) 40 MG capsule Take 1 capsule (40 mg total) by mouth daily. 90 capsule 3   simvastatin (ZOCOR) 40 MG tablet Take 1 tablet (40 mg total) by mouth daily. 90 tablet 1   Semaglutide (RYBELSUS) 14 MG TABS Take 1 tablet (14 mg total) by mouth daily. 30 tablet 5   No facility-administered medications prior to visit.    ROS: Review of Systems  Constitutional:  Positive for fatigue. Negative for appetite change and unexpected weight change.  HENT:  Negative for congestion, nosebleeds, sneezing, sore throat and trouble swallowing.   Eyes:  Negative for itching and visual disturbance.  Respiratory:  Negative for cough.   Cardiovascular:  Negative for chest pain, palpitations and leg swelling.  Gastrointestinal:  Negative for abdominal distention, [name] in stool, diarrhea and nausea.  Genitourinary:  Negative for frequency and hematuria.  Musculoskeletal:  Negative for back pain, gait problem, joint swelling and neck pain.  Skin:  Negative for rash.  Neurological:  Negative for dizziness, tremors, speech  difficulty and weakness.  Psychiatric/Behavioral:  Negative for agitation, dysphoric mood and sleep disturbance. The patient is not nervous/anxious.     Objective:  BP 120/80 (BP Location: Left Arm, Patient Position: Sitting, Cuff Size: Large)   Pulse 81   Temp 97.7 F (36.5 C) (Oral)   Ht 6' 2"$  (1.88 m)   Wt 197 lb (89.4 kg)   SpO2 99%   BMI 25.29 kg/m   BP Readings from Last 3 Encounters:  06/14/22 120/80  02/09/22 118/72  10/10/21 118/80    Wt Readings from Last 3 Encounters:  06/14/22 197 lb (89.4 kg)  02/09/22 197 lb 9.6 oz (89.6 kg)  10/10/21 204 lb (92.5 kg)    Physical Exam Constitutional:      General: He is not in acute distress.    Appearance: He is well-developed. He is obese.     Comments: NAD  Eyes:     Conjunctiva/sclera: Conjunctivae normal.     Pupils: Pupils are equal, round, and reactive to light.  Neck:     Thyroid: No thyromegaly.     Vascular: No JVD.  Cardiovascular:     Rate and Rhythm: Normal rate and regular rhythm.     Heart sounds: Normal heart sounds. No murmur heard.    No friction rub. No gallop.  Pulmonary:     Effort: Pulmonary effort is normal. No respiratory distress.     Breath sounds: Normal breath sounds. No wheezing or rales.  Chest:  Chest wall: No tenderness.  Abdominal:     General: Bowel sounds are normal. There is no distension.     Palpations: Abdomen is soft. There is no mass.     Tenderness: There is no abdominal tenderness. There is no guarding or rebound.  Musculoskeletal:        General: No tenderness. Normal range of motion.     Cervical back: Normal range of motion.  Lymphadenopathy:     Cervical: No cervical adenopathy.  Skin:    General: Skin is warm and dry.     Findings: No rash.  Neurological:     Mental Status: He is alert and oriented to person, place, and time.     Cranial Nerves: No cranial nerve deficit.     Motor: No abnormal muscle tone.     Coordination: Coordination normal.     Gait:  Gait normal.     Deep Tendon Reflexes: Reflexes are normal and symmetric.  Psychiatric:        Behavior: Behavior normal.        Thought Content: Thought content normal.        Judgment: Judgment normal.     Lab Results  Component Value Date   WBC 6.4 06/12/2022   HGB 14.4 06/12/2022   HCT 42.2 06/12/2022   PLT 230.0 06/12/2022   GLUCOSE 92 06/12/2022   CHOL 165 06/12/2022   TRIG 155.0 (H) 06/12/2022   HDL 43.00 06/12/2022   LDLDIRECT 135.6 01/25/2012   LDLCALC 91 06/12/2022   ALT 14 06/12/2022   AST 11 06/12/2022   NA 138 06/12/2022   K 4.5 06/12/2022   CL 101 06/12/2022   CREATININE 0.98 06/12/2022   BUN 14 06/12/2022   CO2 27 06/12/2022   TSH 0.04 (L) 06/12/2022   PSA 0.63 06/12/2022   INR 0.9 RATIO 01/28/2007   HGBA1C 6.2 06/12/2022    CT CARDIAC SCORING  Addendum Date: 02/07/2019   ADDENDUM REPORT: 02/07/2019 07:50 EXAM: OVER-READ INTERPRETATION  CT CHEST The following report is an over-read performed by radiologist Dr. Alvino [name] [facility] on 02/07/2019. This over-read does not include interpretation of cardiac or coronary anatomy or pathology. The calcium score interpretation by the cardiologist is attached. COMPARISON:  None. FINDINGS: Limited view of the lung parenchyma demonstrates paraseptal emphysema in the LEFT lung. Airways are normal. Limited view of the mediastinum demonstrates no adenopathy. Esophagus normal. Limited view of the upper abdomen unremarkable. Limited view of the skeleton and chest wall is unremarkable. IMPRESSION: 1. Paraseptal emphysema. 2. No acute findings. Electronically Signed   By: [name] [name] M.D.   On: 02/07/2019 07:50   Result Date: 02/07/2019 CLINICAL DATA:  Risk stratification EXAM: Coronary Calcium Score MEDICATIONS: None TECHNIQUE: The patient was scanned on a [employer]. Axial non-contrast 3 mm slices were carried out through the heart. The data set was analyzed on a dedicated work station and  scored using the [place]. FINDINGS: Non-cardiac: See separate report from [facility]. Ascending Aorta: Normal size, trivial calcifications. Pericardium: Normal. Coronary arteries: Normal size. IMPRESSION: Coronary calcium score of 184. This was 71 percentile for age and sex matched control. Electronically Signed: By: Ena Dawley On: 02/06/2019 19:00    Assessment & Plan:   Problem List Items Addressed This Visit       Cardiovascular and Mediastinum   Coronary atherosclerosis    Continue on Simvastatin        Endocrine   Hypothyroidism    Cont on  Levothroid      Relevant Orders   TSH   T4, free   T3, free   Diabetes mellitus type 2 in obese ([place]) - Primary    Pt had side effects w/Rybelsus 14 mg - constipation. Will go back to 7 mg/d      Relevant Medications   Semaglutide (RYBELSUS) 7 MG TABS     Other   Fatigue    Working 60-80 h/wk      Dyslipidemia    Simvastatin-we will continue         Meds ordered this encounter  Medications   Semaglutide (RYBELSUS) 7 MG TABS    Sig: Take 1 tablet (7 mg total) by mouth daily. Take 30 min ac    Dispense:  90 tablet    Refill:  3      Follow-up: No follow-ups on file.  Walker Kehr, MD

## 2022-06-14 NOTE — Assessment & Plan Note (Signed)
Pt had side effects w/Rybelsus 14 mg - constipation. Will go back to 7 mg/d

## 2022-08-01 ENCOUNTER — Other Ambulatory Visit: Payer: Self-pay | Admitting: Internal Medicine

## 2022-09-23 ENCOUNTER — Other Ambulatory Visit: Payer: Self-pay | Admitting: Internal Medicine

## 2022-10-02 ENCOUNTER — Encounter: Payer: Self-pay | Admitting: Internal Medicine

## 2022-10-02 MED ORDER — LEVOTHYROXINE SODIUM 150 MCG PO TABS: 150.0000 ug | ORAL_TABLET | Freq: Every day | ORAL | 2 refills | Status: DC

## 2022-10-16 ENCOUNTER — Ambulatory Visit (INDEPENDENT_AMBULATORY_CARE_PROVIDER_SITE_OTHER): Payer: Federal, State, Local not specified - PPO

## 2022-10-16 ENCOUNTER — Ambulatory Visit: Payer: Federal, State, Local not specified - PPO | Admitting: Internal Medicine

## 2022-10-16 ENCOUNTER — Encounter: Payer: Self-pay | Admitting: Internal Medicine

## 2022-10-16 VITALS — BP 130/82 | HR 98 | Temp 98.1°F | Ht 74.0 in | Wt 196.0 lb

## 2022-10-16 DIAGNOSIS — E034 Atrophy of thyroid (acquired): Secondary | ICD-10-CM | POA: Diagnosis not present

## 2022-10-16 DIAGNOSIS — M4312 Spondylolisthesis, cervical region: Secondary | ICD-10-CM | POA: Diagnosis not present

## 2022-10-16 DIAGNOSIS — M542 Cervicalgia: Secondary | ICD-10-CM | POA: Diagnosis not present

## 2022-10-16 DIAGNOSIS — R739 Hyperglycemia, unspecified: Secondary | ICD-10-CM | POA: Diagnosis not present

## 2022-10-16 DIAGNOSIS — J0100 Acute maxillary sinusitis, unspecified: Secondary | ICD-10-CM

## 2022-10-16 DIAGNOSIS — E669 Obesity, unspecified: Secondary | ICD-10-CM

## 2022-10-16 DIAGNOSIS — E1169 Type 2 diabetes mellitus with other specified complication: Secondary | ICD-10-CM

## 2022-10-16 LAB — COMPREHENSIVE METABOLIC PANEL
ALT: 15 U/L (ref 0–53)
AST: 13 U/L (ref 0–37)
Albumin: 4.3 g/dL (ref 3.5–5.2)
Alkaline Phosphatase: 71 U/L (ref 39–117)
BUN: 8 mg/dL (ref 6–23)
CO2: 29 mEq/L (ref 19–32)
Calcium: 9.4 mg/dL (ref 8.4–10.5)
Chloride: 102 mEq/L (ref 96–112)
Creatinine, Ser: 0.96 mg/dL (ref 0.40–1.50)
GFR: 85.99 mL/min (ref >60.00)
Glucose, Bld: 84 mg/dL (ref 70–99)
Potassium: 3.7 mEq/L (ref 3.5–5.1)
Sodium: 138 mEq/L (ref 135–145)
Total Bilirubin: 0.3 mg/dL (ref 0.2–1.2)
Total Protein: 7.5 g/dL (ref 6.0–8.3)

## 2022-10-16 LAB — HEMOGLOBIN A1C: Hgb A1c MFr Bld: 6 % (ref 4.6–6.5)

## 2022-10-16 LAB — T4, FREE: Free T4: 1.36 ng/dL (ref 0.60–1.60)

## 2022-10-16 MED ORDER — CYCLOBENZAPRINE HCL 5 MG PO TABS: 5.0000 mg | ORAL_TABLET | Freq: Three times a day (TID) | ORAL | 1 refills | Status: DC | PRN

## 2022-10-16 MED ORDER — CEFDINIR 300 MG PO CAPS: 300.0000 mg | ORAL_CAPSULE | Freq: Two times a day (BID) | ORAL | 0 refills | Status: DC

## 2022-10-16 MED ORDER — PREDNISONE 10 MG PO TABS: ORAL_TABLET | ORAL | 1 refills | Status: DC

## 2022-10-16 MED ORDER — HYDROCODONE-ACETAMINOPHEN 5-325 MG PO TABS: 1.0000 | ORAL_TABLET | Freq: Four times a day (QID) | ORAL | 0 refills | Status: DC | PRN

## 2022-10-16 NOTE — Assessment & Plan Note (Signed)
Severe X ray Norco  Potential benefits of a lshort term opioids use as well as potential risks (i.e. addiction risk, apnea etc) and complications (i.e. Somnolence, constipation and others) were explained to the patient and were aknowledged.  Prednisone taper  Potential benefits of a short term steroid  use as well as potential risks  and complications were explained to the patient and were aknowledged.  Flexeril

## 2022-10-16 NOTE — Progress Notes (Signed)
Subjective:  Patient ID: UTAH [name], male    DOB: 1961/04/21  Age: 62 y.o. MRN: [id number]  CC: Follow-up (4 mnth up, SINUS PRESSURE, SINUS HEADACHE AND NECK PAIN)   HPI [name] [name] presents for neck pain L>R 5 days, not better Taking Tylenol, Advil Pain is 7-9/10. No HA C/o sinusitis sx's x 1 month  Outpatient Medications Prior to Visit  Medication Sig Dispense Refill   Albuterol Sulfate (PROAIR RESPICLICK) 108 (90 Base) MCG/ACT AEPB Inhale 1-2 puffs into the lungs 4 (four) times daily as needed. 1 each 5   Armodafinil 150 MG tablet TAKE 1 TABLET BY MOUTH EVERY DAY IN THE MORNING 30 tablet 5   cholecalciferol (VITAMIN D) 1000 UNITS tablet Take 1,000 Units by mouth daily.     levothyroxine (SYNTHROID) 150 MCG tablet Take 1 tablet (150 mcg total) by mouth daily. 90 tablet 2   metFORMIN (GLUCOPHAGE) 500 MG tablet TAKE 1 TABLET BY MOUTH EVERY DAY WITH BREAKFAST 90 tablet 3   montelukast (SINGULAIR) 10 MG tablet TAKE 1 TABLET BY MOUTH EVERY DAY 90 tablet 1   omeprazole (PRILOSEC) 40 MG capsule Take 1 capsule (40 mg total) by mouth daily. 90 capsule 3   Semaglutide (RYBELSUS) 7 MG TABS Take 1 tablet (7 mg total) by mouth daily. Take 30 min ac 90 tablet 3   simvastatin (ZOCOR) 40 MG tablet TAKE 1 TABLET BY MOUTH EVERY DAY 90 tablet 2   No facility-administered medications prior to visit.    ROS: Review of Systems  Objective:  BP 130/82 (BP Location: Right Arm, Patient Position: Sitting, Cuff Size: Large)   Pulse 98   Temp 98.1 F (36.7 C) (Oral)   Ht 6\' 2"  (1.88 m)   Wt 196 lb (88.9 kg)   SpO2 95%   BMI 25.16 kg/m   BP Readings from Last 3 Encounters:  10/16/22 130/82  06/14/22 120/80  02/09/22 118/72    Wt Readings from Last 3 Encounters:  10/16/22 196 lb (88.9 kg)  06/14/22 197 lb (89.4 kg)  02/09/22 197 lb 9.6 oz (89.6 kg)    Physical Exam  Lab Results  Component Value Date   WBC 6.4 06/12/2022   HGB 14.4 06/12/2022   HCT 42.2 06/12/2022   PLT  230.0 06/12/2022   GLUCOSE 92 06/12/2022   CHOL 165 06/12/2022   TRIG 155.0 (H) 06/12/2022   HDL 43.00 06/12/2022   LDLDIRECT 135.6 01/25/2012   LDLCALC 91 06/12/2022   ALT 14 06/12/2022   AST 11 06/12/2022   NA 138 06/12/2022   K 4.5 06/12/2022   CL 101 06/12/2022   CREATININE 0.98 06/12/2022   BUN 14 06/12/2022   CO2 27 06/12/2022   TSH 0.04 (L) 06/14/2022   PSA 0.63 06/12/2022   INR 0.9 RATIO 01/28/2007   HGBA1C 6.2 06/12/2022    CT CARDIAC SCORING  Addendum Date: 02/07/2019   ADDENDUM REPORT: 02/07/2019 07:50 EXAM: OVER-READ INTERPRETATION  CT CHEST The following report is an over-read performed by radiologist Dr. Maryelizabeth Rowan [facility] on 02/07/2019. This over-read does not include interpretation of cardiac or coronary anatomy or pathology. The calcium score interpretation by the cardiologist is attached. COMPARISON:  None. FINDINGS: Limited view of the lung parenchyma demonstrates paraseptal emphysema in the LEFT lung. Airways are normal. Limited view of the mediastinum demonstrates no adenopathy. Esophagus normal. Limited view of the upper abdomen unremarkable. Limited view of the skeleton and chest wall is unremarkable. IMPRESSION: 1. Paraseptal emphysema. 2. No acute  findings. Electronically Signed   By: Genevive Bi M.D.   On: 02/07/2019 07:50   Result Date: 02/07/2019 CLINICAL DATA:  Risk stratification EXAM: Coronary Calcium Score MEDICATIONS: None TECHNIQUE: The patient was scanned on a [employer]. Axial non-contrast 3 mm slices were carried out through the heart. The data set was analyzed on a dedicated work station and scored using the [place] method. FINDINGS: Non-cardiac: See separate report from [facility]. Ascending Aorta: Normal size, trivial calcifications. Pericardium: Normal. Coronary arteries: Normal size. IMPRESSION: Coronary calcium score of 184. This was 55 percentile for age and sex matched control. Electronically  Signed: By: Tobias Alexander On: 02/06/2019 19:00    Assessment & Plan:   Problem List Items Addressed This Visit     Hypothyroidism   Relevant Orders   Comprehensive metabolic panel   T4, free   Hemoglobin A1c   Acute sinusitis   Relevant Medications   cefdinir (OMNICEF) 300 MG capsule   predniSONE (DELTASONE) 10 MG tablet   Type 2 diabetes mellitus with obesity (H[facility])    Check A1c      Neck pain, bilateral - Primary    Severe X ray Norco  Potential benefits of a lshort term opioids use as well as potential risks (i.e. addiction risk, apnea etc) and complications (i.e. Somnolence, constipation and others) were explained to the patient and were aknowledged.  Prednisone taper  Potential benefits of a short term steroid  use as well as potential risks  and complications were explained to the patient and were aknowledged.  Flexeril      Relevant Orders   DG Cervical Spine Complete   Comprehensive metabolic panel   T4, free   Other Visit Diagnoses     Hyperglycemia       Relevant Orders   Comprehensive metabolic panel   Hemoglobin A1c         Meds ordered this encounter  Medications   cefdinir (OMNICEF) 300 MG capsule    Sig: Take 1 capsule (300 mg total) by mouth 2 (two) times daily.    Dispense:  20 capsule    Refill:  0   predniSONE (DELTASONE) 10 MG tablet    Sig: Prednisone 10 mg: take 4 tabs a day x 3 days; then 3 tabs a day x 4 days; then 2 tabs a day x 4 days, then 1 tab a day x 6 days, then stop. Take pc.    Dispense:  38 tablet    Refill:  1   HYDROcodone-acetaminophen (NORCO) 5-325 MG tablet    Sig: Take 1 tablet by mouth every 6 (six) hours as needed for moderate pain.    Dispense:  20 tablet    Refill:  0   cyclobenzaprine (FLEXERIL) 5 MG tablet    Sig: Take 1 tablet (5 mg total) by mouth 3 (three) times daily as needed for muscle spasms.    Dispense:  30 tablet    Refill:  1      Follow-up: Return in about 4 months (around 02/15/2023) for  a follow-up visit.  Sonda Primes, MD

## 2022-10-16 NOTE — Assessment & Plan Note (Signed)
Check A1c. 

## 2023-01-18 ENCOUNTER — Other Ambulatory Visit: Payer: Self-pay | Admitting: Internal Medicine

## 2023-01-22 ENCOUNTER — Encounter: Payer: Self-pay | Admitting: Internal Medicine

## 2023-01-22 ENCOUNTER — Ambulatory Visit: Payer: Federal, State, Local not specified - PPO | Admitting: Internal Medicine

## 2023-01-22 VITALS — BP 114/68 | HR 100 | Temp 98.2°F | Ht 74.0 in | Wt 201.0 lb

## 2023-01-22 DIAGNOSIS — E669 Obesity, unspecified: Secondary | ICD-10-CM

## 2023-01-22 DIAGNOSIS — J329 Chronic sinusitis, unspecified: Secondary | ICD-10-CM | POA: Diagnosis not present

## 2023-01-22 DIAGNOSIS — Z720 Tobacco use: Secondary | ICD-10-CM | POA: Diagnosis not present

## 2023-01-22 DIAGNOSIS — J309 Allergic rhinitis, unspecified: Secondary | ICD-10-CM | POA: Diagnosis not present

## 2023-01-22 DIAGNOSIS — E1169 Type 2 diabetes mellitus with other specified complication: Secondary | ICD-10-CM

## 2023-01-22 DIAGNOSIS — Z7984 Long term (current) use of oral hypoglycemic drugs: Secondary | ICD-10-CM

## 2023-01-22 MED ORDER — DOXYCYCLINE HYCLATE 100 MG PO TABS
100.0000 mg | ORAL_TABLET | Freq: Two times a day (BID) | ORAL | 0 refills | Status: DC
Start: 2023-01-22 — End: 2023-02-15

## 2023-01-22 MED ORDER — PREDNISONE 10 MG PO TABS: ORAL_TABLET | ORAL | 0 refills | Status: DC

## 2023-01-22 MED ORDER — METHYLPREDNISOLONE ACETATE 80 MG/ML IJ SUSP
80.0000 mg | Freq: Once | INTRAMUSCULAR | Status: AC
Start: 2023-01-22 — End: 2023-01-22
  Administered 2023-01-22: 80 mg via INTRAMUSCULAR

## 2023-01-22 NOTE — Assessment & Plan Note (Signed)
Lab Results  Component Value Date   HGBA1C 6.0 10/16/2022   Stable, pt to continue current medical treatment metformin 500 every day, rybelsus 7 mg qd

## 2023-01-22 NOTE — Assessment & Plan Note (Signed)
Pt counsled to stop smoking, pt not ready

## 2023-01-22 NOTE — Assessment & Plan Note (Addendum)
Mild to mod, for prednisone taper and depomedrol 80 mg IM,  to f/u any worsening symptoms or concerns

## 2023-01-22 NOTE — Assessment & Plan Note (Signed)
Mild to mod, for antibx course doxycycline 100 bid  to f/u any worsening symptoms or concerns

## 2023-01-22 NOTE — Progress Notes (Signed)
Patient ID: [name] [name], male   DOB: [birth year], 62 y.o.   MRN: [id number]        Chief Complaint: follow up 3 wks onset uri symptoms, allergies, dm, smoker       HPI:  [name] [name] is a 62 y.o. male  Here with 2-3 wks acute onset fever, facial pain, pressure, headache, general weakness and malaise, and greenish d/c, with mild ST and cough, but pt denies chest pain, wheezing, increased sob or doe, orthopnea, PND, increased LE swelling, palpitations, dizziness or syncope.   Pt denies polydipsia, polyuria, or new focal neuro s/s.    Pt denies fever, wt loss, night sweats, loss of appetite, or other constitutional symptoms  Does have several wks ongoing nasal allergy symptoms with clearish congestion, itch and sneezing, without fever, pain, ST, cough, swelling or wheezing.  Still smoking, not ready to quit       Wt Readings from Last 3 Encounters:  01/22/23 201 lb (91.2 kg)  10/16/22 196 lb (88.9 kg)  06/14/22 197 lb (89.4 kg)   BP Readings from Last 3 Encounters:  01/22/23 114/68  10/16/22 130/82  06/14/22 120/80         Past Medical History:  Diagnosis Date   ACL tear    Bilateral- Dr. Wyline Mood   Alcohol abuse    Asthma    Depression    Hyperlipidemia    Hypothyroidism    Kidney stone    hx   OA (osteoarthritis)    Past Surgical History:  Procedure Laterality Date   FINGER SURGERY     NASAL SINUS SURGERY  1988/2008    reports that he has been smoking cigarettes. He has never used smokeless tobacco. He reports that he does not drink alcohol and does not use drugs. family history includes Heart disease in his mother. No Known Allergies Current Outpatient Medications on File Prior to Visit  Medication Sig Dispense Refill   Albuterol Sulfate (PROAIR RESPICLICK) 108 (90 Base) MCG/ACT AEPB Inhale 1-2 puffs into the lungs 4 (four) times daily as needed. 1 each 5   Armodafinil 150 MG tablet TAKE 1 TABLET BY MOUTH EVERY DAY IN THE MORNING 30 tablet 3   cefdinir (OMNICEF) 300 MG  capsule Take 1 capsule (300 mg total) by mouth 2 (two) times daily. 20 capsule 0   cholecalciferol (VITAMIN D) 1000 UNITS tablet Take 1,000 Units by mouth daily.     cyclobenzaprine (FLEXERIL) 5 MG tablet Take 1 tablet (5 mg total) by mouth 3 (three) times daily as needed for muscle spasms. 30 tablet 1   HYDROcodone-acetaminophen (NORCO) 5-325 MG tablet Take 1 tablet by mouth every 6 (six) hours as needed for moderate pain. 20 tablet 0   levothyroxine (SYNTHROID) 150 MCG tablet Take 1 tablet (150 mcg total) by mouth daily. 90 tablet 2   metFORMIN (GLUCOPHAGE) 500 MG tablet TAKE 1 TABLET BY MOUTH EVERY DAY WITH BREAKFAST 90 tablet 3   montelukast (SINGULAIR) 10 MG tablet TAKE 1 TABLET BY MOUTH EVERY DAY 90 tablet 1   omeprazole (PRILOSEC) 40 MG capsule Take 1 capsule (40 mg total) by mouth daily. 90 capsule 3   simvastatin (ZOCOR) 40 MG tablet TAKE 1 TABLET BY MOUTH EVERY DAY 90 tablet 2   Semaglutide (RYBELSUS) 7 MG TABS Take 1 tablet (7 mg total) by mouth daily. Take 30 min ac (Patient not taking: Reported on 01/22/2023) 90 tablet 3   No current facility-administered medications on file prior to visit.  ROS:  All others reviewed and negative.  Objective        PE:  BP 114/68 (BP Location: Right Arm, Patient Position: Sitting, Cuff Size: Normal)   Pulse 100   Temp 98.2 F (36.8 C) (Oral)   Ht 6\' 2"  (1.88 m)   Wt 201 lb (91.2 kg)   SpO2 99%   BMI 25.81 kg/m                 Constitutional: Pt appears in NAD               HENT: Head: NCAT.                Right Ear: External ear normal.                 Left Ear: External ear normal.                Eyes: . Pupils are equal, round, and reactive to light. Conjunctivae and EOM are normal               Nose: without d/c or deformity               Neck: Neck supple. Gross normal ROM               Cardiovascular: Normal rate and regular rhythm.                 Pulmonary/Chest: Effort normal and breath sounds without rales or wheezing.                 Abd:  Soft, NT, ND, + BS, no organomegaly               Neurological: Pt is alert. At baseline orientation, motor grossly intact               Skin: Skin is warm. No rashes, no other new lesions, LE edema - none               Psychiatric: Pt behavior is normal without agitation   Micro: none  Cardiac tracings I have personally interpreted today:  none  Pertinent Radiological findings (summarize): none   Lab Results  Component Value Date   WBC 6.4 06/12/2022   HGB 14.4 06/12/2022   HCT 42.2 06/12/2022   PLT 230.0 06/12/2022   GLUCOSE 84 10/16/2022   CHOL 165 06/12/2022   TRIG 155.0 (H) 06/12/2022   HDL 43.00 06/12/2022   LDLDIRECT 135.6 01/25/2012   LDLCALC 91 06/12/2022   ALT 15 10/16/2022   AST 13 10/16/2022   NA 138 10/16/2022   K 3.7 10/16/2022   CL 102 10/16/2022   CREATININE 0.96 10/16/2022   BUN 8 10/16/2022   CO2 29 10/16/2022   TSH 0.04 (L) 06/14/2022   PSA 0.63 06/12/2022   INR 0.9 RATIO 01/28/2007   HGBA1C 6.0 10/16/2022   Assessment/Plan:  [name] [name] is a 62 y.o. White or Caucasian [1] male with  has a past medical history of ACL tear, Alcohol abuse, Asthma, Depression, Hyperlipidemia, Hypothyroidism, Kidney stone, and OA (osteoarthritis).  Allergic rhinitis Mild to mod, for prednisone taper and depomedrol 80 mg IM,  to f/u any worsening symptoms or concerns  Sinus infection Mild to mod, for antibx course doxycycline 100 bid  to f/u any worsening symptoms or concerns  Tobacco use Pt counsled to stop smoking, pt not ready  Type 2 diabetes mellitus with obesity (HCC) Lab Results  Component  Value Date   HGBA1C 6.0 10/16/2022   Stable, pt to continue current medical treatment metformin 500 every day, rybelsus 7 mg qd  Followup: Return if symptoms worsen or fail to improve.  Oliver Barre, MD 01/22/2023 8:54 PM [facility] [place]

## 2023-01-22 NOTE — Patient Instructions (Signed)
You had the steroid shot today  Please take all new medication as prescribed - the antibiotic, and prednisone  Please continue all other medications as before, including zyrtec and singulair as you do  Please have the pharmacy call with any other refills you may need.  Please continue your efforts at being more active, low cholesterol diet, and weight control.  Please keep your appointments with your specialists as you may have planned

## 2023-02-15 ENCOUNTER — Ambulatory Visit: Payer: Federal, State, Local not specified - PPO | Admitting: Internal Medicine

## 2023-02-15 VITALS — BP 120/78 | HR 87 | Temp 97.9°F | Ht 74.0 in | Wt 200.0 lb

## 2023-02-15 DIAGNOSIS — E034 Atrophy of thyroid (acquired): Secondary | ICD-10-CM

## 2023-02-15 DIAGNOSIS — E669 Obesity, unspecified: Secondary | ICD-10-CM

## 2023-02-15 DIAGNOSIS — Z23 Encounter for immunization: Secondary | ICD-10-CM | POA: Diagnosis not present

## 2023-02-15 DIAGNOSIS — J309 Allergic rhinitis, unspecified: Secondary | ICD-10-CM | POA: Diagnosis not present

## 2023-02-15 DIAGNOSIS — I2583 Coronary atherosclerosis due to lipid rich plaque: Secondary | ICD-10-CM | POA: Diagnosis not present

## 2023-02-15 DIAGNOSIS — E1169 Type 2 diabetes mellitus with other specified complication: Secondary | ICD-10-CM | POA: Diagnosis not present

## 2023-02-15 LAB — COMPREHENSIVE METABOLIC PANEL
ALT: 21 U/L (ref 0–53)
AST: 18 U/L (ref 0–37)
Albumin: 4.5 g/dL (ref 3.5–5.2)
Alkaline Phosphatase: 73 U/L (ref 39–117)
BUN: 12 mg/dL (ref 6–23)
CO2: 30 meq/L (ref 19–32)
Calcium: 10.2 mg/dL (ref 8.4–10.5)
Chloride: 101 meq/L (ref 96–112)
Creatinine, Ser: 0.97 mg/dL (ref 0.40–1.50)
GFR: 84.72 mL/min (ref >60.00)
Glucose, Bld: 99 mg/dL (ref 70–99)
Potassium: 4.3 meq/L (ref 3.5–5.1)
Sodium: 138 meq/L (ref 135–145)
Total Bilirubin: 0.5 mg/dL (ref 0.2–1.2)
Total Protein: 7.4 g/dL (ref 6.0–8.3)

## 2023-02-15 LAB — HEMOGLOBIN A1C: Hgb A1c MFr Bld: 6.3 % (ref 4.6–6.5)

## 2023-02-15 MED ORDER — LEVOCETIRIZINE DIHYDROCHLORIDE 5 MG PO TABS: 5.0000 mg | ORAL_TABLET | Freq: Every evening | ORAL | 3 refills | Status: DC

## 2023-02-15 MED ORDER — PSEUDOEPHEDRINE HCL ER 120 MG PO TB12: 120.0000 mg | ORAL_TABLET | Freq: Two times a day (BID) | ORAL | 3 refills | Status: AC | PRN

## 2023-02-15 MED ORDER — AZELASTINE-FLUTICASONE 137-50 MCG/ACT NA SUSP
1.0000 | NASAL | 5 refills | Status: AC
Start: 2023-02-15 — End: ?

## 2023-02-15 NOTE — Assessment & Plan Note (Signed)
Chronic - worse Singulair, Xyzal  Dymista Rx Sudafed 12 h prn

## 2023-02-15 NOTE — Progress Notes (Signed)
Subjective:  Patient ID: [name] [name], male    DOB: 20-Aug-1960  Age: 62 y.o. MRN: [id number]  CC: Follow-up (4 MNTH F/U, Pt states [name] [name] has having a lot of pot nasal drip/mucus in the back of his throat when [name] [name] wakes up and is coughing it up all day.)   HPI [name] [name] presents for a bad post-nasal drip - clear d/c [name] [name] stopped Rybelsus due to GERD, constipation 7 wks ago, [name] [name] lost 27 lbs  Outpatient Medications Prior to Visit  Medication Sig Dispense Refill   Albuterol Sulfate (PROAIR RESPICLICK) 108 (90 Base) MCG/ACT AEPB Inhale 1-2 puffs into the lungs 4 (four) times daily as needed. 1 each 5   Armodafinil 150 MG tablet TAKE 1 TABLET BY MOUTH EVERY DAY IN THE MORNING 30 tablet 3   cholecalciferol (VITAMIN D) 1000 UNITS tablet Take 1,000 Units by mouth daily.     levothyroxine (SYNTHROID) 150 MCG tablet Take 1 tablet (150 mcg total) by mouth daily. 90 tablet 2   metFORMIN (GLUCOPHAGE) 500 MG tablet TAKE 1 TABLET BY MOUTH EVERY DAY WITH BREAKFAST 90 tablet 3   montelukast (SINGULAIR) 10 MG tablet TAKE 1 TABLET BY MOUTH EVERY DAY 90 tablet 1   omeprazole (PRILOSEC) 40 MG capsule Take 1 capsule (40 mg total) by mouth daily. 90 capsule 3   simvastatin (ZOCOR) 40 MG tablet TAKE 1 TABLET BY MOUTH EVERY DAY 90 tablet 2   cefdinir (OMNICEF) 300 MG capsule Take 1 capsule (300 mg total) by mouth 2 (two) times daily. 20 capsule 0   cyclobenzaprine (FLEXERIL) 5 MG tablet Take 1 tablet (5 mg total) by mouth 3 (three) times daily as needed for muscle spasms. 30 tablet 1   doxycycline (VIBRA-TABS) 100 MG tablet Take 1 tablet (100 mg total) by mouth 2 (two) times daily. 20 tablet 0   HYDROcodone-acetaminophen (NORCO) 5-325 MG tablet Take 1 tablet by mouth every 6 (six) hours as needed for moderate pain. 20 tablet 0   predniSONE (DELTASONE) 10 MG tablet 3 tabs by mouth per day for 3 days,2tabs per day for 3 days,1tab per day for 3 days 18 tablet 0   Semaglutide (RYBELSUS) 7 MG TABS Take 1 tablet (7  mg total) by mouth daily. Take 30 min ac (Patient not taking: Reported on 01/22/2023) 90 tablet 3   No facility-administered medications prior to visit.    ROS: Review of Systems  Constitutional:  Negative for appetite change, fatigue and unexpected weight change.  HENT:  Positive for congestion. Negative for nosebleeds, sneezing, sore throat and trouble swallowing.   Eyes:  Negative for itching and visual disturbance.  Respiratory:  Negative for cough.   Cardiovascular:  Negative for chest pain, palpitations and leg swelling.  Gastrointestinal:  Negative for abdominal distention, blood in stool, diarrhea and nausea.  Genitourinary:  Negative for frequency and hematuria.  Musculoskeletal:  Negative for back pain, gait problem, joint swelling and neck pain.  Skin:  Negative for rash.  Neurological:  Negative for dizziness, tremors, speech difficulty and weakness.  Psychiatric/Behavioral:  Negative for agitation, dysphoric mood and sleep disturbance. The patient is not nervous/anxious.     Objective:  BP 120/78 (BP Location: Left Arm, Patient Position: Sitting, Cuff Size: Normal)   Pulse 87   Temp 97.9 F (36.6 C) (Oral)   Ht 6\' 2"  (1.88 m)   Wt 200 lb (90.7 kg)   SpO2 97%   BMI 25.68 kg/m   BP Readings from Last 3 Encounters:  02/15/23 120/78  01/22/23 114/68  10/16/22 130/82    Wt Readings from Last 3 Encounters:  02/15/23 200 lb (90.7 kg)  01/22/23 201 lb (91.2 kg)  10/16/22 196 lb (88.9 kg)    Physical Exam Constitutional:      General: [name] [name] is not in acute distress.    Appearance: [name] [name] is well-developed. [name] [name] is obese.     Comments: NAD  Eyes:     Conjunctiva/sclera: Conjunctivae normal.     Pupils: Pupils are equal, round, and reactive to light.  Neck:     Thyroid: No thyromegaly.     Vascular: No JVD.  Cardiovascular:     Rate and Rhythm: Normal rate and regular rhythm.     Heart sounds: Normal heart sounds. No murmur heard.    No friction rub. No gallop.   Pulmonary:     Effort: Pulmonary effort is normal. No respiratory distress.     Breath sounds: Normal breath sounds. No wheezing or rales.  Chest:     Chest wall: No tenderness.  Abdominal:     General: Bowel sounds are normal. There is no distension.     Palpations: Abdomen is soft. There is no mass.     Tenderness: There is no abdominal tenderness. There is no guarding or rebound.  Musculoskeletal:        General: No tenderness. Normal range of motion.     Cervical back: Normal range of motion.  Lymphadenopathy:     Cervical: No cervical adenopathy.  Skin:    General: Skin is warm and dry.     Findings: No rash.  Neurological:     Mental Status: [name] [name] is alert and oriented to person, place, and time.     Cranial Nerves: No cranial nerve deficit.     Motor: No abnormal muscle tone.     Coordination: Coordination normal.     Gait: Gait normal.     Deep Tendon Reflexes: Reflexes are normal and symmetric.  Psychiatric:        Behavior: Behavior normal.        Thought Content: Thought content normal.        Judgment: Judgment normal.   Swollen nasal mucosa  Lab Results  Component Value Date   WBC 6.4 06/12/2022   HGB 14.4 06/12/2022   HCT 42.2 06/12/2022   PLT 230.0 06/12/2022   GLUCOSE 84 10/16/2022   CHOL 165 06/12/2022   TRIG 155.0 (H) 06/12/2022   HDL 43.00 06/12/2022   LDLDIRECT 135.6 01/25/2012   LDLCALC 91 06/12/2022   ALT 15 10/16/2022   AST 13 10/16/2022   NA 138 10/16/2022   K 3.7 10/16/2022   CL 102 10/16/2022   CREATININE 0.96 10/16/2022   BUN 8 10/16/2022   CO2 29 10/16/2022   TSH 0.04 (L) 06/14/2022   PSA 0.63 06/12/2022   INR 0.9 RATIO 01/28/2007   HGBA1C 6.0 10/16/2022    CT CARDIAC SCORING  Addendum Date: 02/07/2019   ADDENDUM REPORT: 02/07/2019 07:50 EXAM: OVER-READ INTERPRETATION  CT CHEST The following report is an over-read performed by radiologist Dr. Maryelizabeth Rowan [facility] on 02/07/2019. This over-read does not include  interpretation of cardiac or coronary anatomy or pathology. The calcium score interpretation by the cardiologist is attached. COMPARISON:  None. FINDINGS: Limited view of the lung parenchyma demonstrates paraseptal emphysema in the LEFT lung. Airways are normal. Limited view of the mediastinum demonstrates no adenopathy. Esophagus normal. Limited view of the upper abdomen unremarkable. Limited view of  the skeleton and chest wall is unremarkable. IMPRESSION: 1. Paraseptal emphysema. 2. No acute findings. Electronically Signed   By: Genevive Bi M.D.   On: 02/07/2019 07:50   Result Date: 02/07/2019 CLINICAL DATA:  Risk stratification EXAM: Coronary Calcium Score MEDICATIONS: None TECHNIQUE: The patient was scanned on a [employer]. Axial non-contrast 3 mm slices were carried out through the heart. The data set was analyzed on a dedicated work station and scored using the Agatson method. FINDINGS: Non-cardiac: See separate report from [facility]. Ascending Aorta: Normal size, trivial calcifications. Pericardium: Normal. Coronary arteries: Normal size. IMPRESSION: Coronary calcium score of 184. This was 42 percentile for age and sex matched control. Electronically Signed: By: Tobias Alexander On: 02/06/2019 19:00    Assessment & Plan:   Problem List Items Addressed This Visit     Hypothyroidism    Cont on Levothroid      Rhinitis    Chronic - worse Singulair, Xyzal  Dymista Rx Sudafed 12 h prn      Coronary atherosclerosis    Continue on Simvastatin      Type 2 diabetes mellitus with obesity (HCC) - Primary    [name] [name] stopped Rybelsus due to GERD, constipation 7 wks ago, [name] [name] lost 27 lbs On a better diet      Relevant Orders   Comprehensive metabolic panel   Hemoglobin A1c   Other Visit Diagnoses     Need for influenza vaccination       Relevant Orders   Flu vaccine trivalent PF, 6mos and older(Flulaval,Afluria,Fluarix,Fluzone) (Completed)         Meds  ordered this encounter  Medications   Azelastine-Fluticasone (DYMISTA) 137-50 MCG/ACT SUSP    Sig: Place 1 Act into the nose 1 day or 1 dose.    Dispense:  23 g    Refill:  5   pseudoephedrine (SUDAFED) 120 MG 12 hr tablet    Sig: Take 1 tablet (120 mg total) by mouth every 12 (twelve) hours as needed for congestion.    Dispense:  60 tablet    Refill:  3   levocetirizine (XYZAL) 5 MG tablet    Sig: Take 1 tablet (5 mg total) by mouth every evening.    Dispense:  90 tablet    Refill:  3      Follow-up: Return in about 3 months (around 05/18/2023) for a follow-up visit.  Sonda Primes, MD

## 2023-02-15 NOTE — Assessment & Plan Note (Signed)
Continue on Simvastatin

## 2023-02-15 NOTE — Assessment & Plan Note (Signed)
[name] [name] stopped Rybelsus due to GERD, constipation 7 wks ago, he lost 27 lbs On a better diet

## 2023-02-15 NOTE — Assessment & Plan Note (Signed)
Cont on Levothroid 

## 2023-03-05 ENCOUNTER — Other Ambulatory Visit (HOSPITAL_COMMUNITY): Payer: Self-pay

## 2023-03-05 ENCOUNTER — Telehealth: Payer: Self-pay

## 2023-03-05 NOTE — Telephone Encounter (Signed)
Pharmacy Patient Advocate Encounter   Received notification from CoverMyMeds that prior authorization for Armodafinil 150MG  tablets is required/requested.   Insurance verification completed.   The patient is insured through [employer] [facility] .   Per test claim: APPROVED from 03-05-2023 to 03-04-2024

## 2023-03-10 ENCOUNTER — Other Ambulatory Visit: Payer: Self-pay | Admitting: Internal Medicine

## 2023-03-29 ENCOUNTER — Other Ambulatory Visit: Payer: Self-pay | Admitting: Internal Medicine

## 2023-05-07 DIAGNOSIS — D1801 Hemangioma of skin and subcutaneous tissue: Secondary | ICD-10-CM | POA: Diagnosis not present

## 2023-05-07 DIAGNOSIS — L821 Other seborrheic keratosis: Secondary | ICD-10-CM | POA: Diagnosis not present

## 2023-05-07 DIAGNOSIS — L812 Freckles: Secondary | ICD-10-CM | POA: Diagnosis not present

## 2023-05-07 DIAGNOSIS — L570 Actinic keratosis: Secondary | ICD-10-CM | POA: Diagnosis not present

## 2023-05-07 DIAGNOSIS — D2261 Melanocytic nevi of right upper limb, including shoulder: Secondary | ICD-10-CM | POA: Diagnosis not present

## 2023-05-10 ENCOUNTER — Other Ambulatory Visit: Payer: Self-pay | Admitting: Internal Medicine

## 2023-05-15 ENCOUNTER — Encounter: Payer: Self-pay | Admitting: Internal Medicine

## 2023-05-15 ENCOUNTER — Ambulatory Visit: Payer: Federal, State, Local not specified - PPO | Admitting: Internal Medicine

## 2023-05-15 VITALS — BP 118/82 | HR 109 | Temp 98.0°F | Ht 74.0 in | Wt 203.0 lb

## 2023-05-15 DIAGNOSIS — M545 Low back pain, unspecified: Secondary | ICD-10-CM

## 2023-05-15 DIAGNOSIS — E669 Obesity, unspecified: Secondary | ICD-10-CM | POA: Diagnosis not present

## 2023-05-15 DIAGNOSIS — E1169 Type 2 diabetes mellitus with other specified complication: Secondary | ICD-10-CM

## 2023-05-15 DIAGNOSIS — G8929 Other chronic pain: Secondary | ICD-10-CM

## 2023-05-15 DIAGNOSIS — E034 Atrophy of thyroid (acquired): Secondary | ICD-10-CM

## 2023-05-15 DIAGNOSIS — G4733 Obstructive sleep apnea (adult) (pediatric): Secondary | ICD-10-CM | POA: Diagnosis not present

## 2023-05-15 LAB — COMPREHENSIVE METABOLIC PANEL
ALT: 18 U/L (ref 0–53)
AST: 15 U/L (ref 0–37)
Albumin: 4.4 g/dL (ref 3.5–5.2)
Alkaline Phosphatase: 71 U/L (ref 39–117)
BUN: 11 mg/dL (ref 6–23)
CO2: 29 meq/L (ref 19–32)
Calcium: 9.3 mg/dL (ref 8.4–10.5)
Chloride: 101 meq/L (ref 96–112)
Creatinine, Ser: 1.12 mg/dL (ref 0.40–1.50)
GFR: 71.18 mL/min (ref >60.00)
Glucose, Bld: 98 mg/dL (ref 70–99)
Potassium: 4 meq/L (ref 3.5–5.1)
Sodium: 138 meq/L (ref 135–145)
Total Bilirubin: 0.5 mg/dL (ref 0.2–1.2)
Total Protein: 7 g/dL (ref 6.0–8.3)

## 2023-05-15 LAB — HEMOGLOBIN A1C: Hgb A1c MFr Bld: 6.6 % — ABNORMAL HIGH (ref 4.6–6.5)

## 2023-05-15 NOTE — Progress Notes (Signed)
 Subjective:  Patient ID: CHALMER ZHENG, male    DOB: Oct 22, 1960  Age: 63 y.o. MRN: [id number]  CC: Medical Management of Chronic Issues (3 mnth f/u, discuss allergy medication Xyzal )   HPI Kaien G Fogel presents for allergies Sudafed helps, Xyzal  does not... F/u asthma, hypothyroidism, DM  Outpatient Medications Prior to Visit  Medication Sig Dispense Refill   Albuterol  Sulfate (PROAIR  RESPICLICK) 108 (90 Base) MCG/ACT AEPB Inhale 1-2 puffs into the lungs 4 (four) times daily as needed. 1 each 5   Armodafinil  150 MG tablet TAKE 1 TABLET BY MOUTH EVERY DAY IN THE MORNING 30 tablet 3   Azelastine -Fluticasone  (DYMISTA ) 137-50 MCG/ACT SUSP Place 1 Act into the nose 1 day or 1 dose. 23 g 5   cholecalciferol (VITAMIN D) 1000 UNITS tablet Take 1,000 Units by mouth daily.     levothyroxine  (SYNTHROID ) 150 MCG tablet Take 1 tablet (150 mcg total) by mouth daily. 90 tablet 2   metFORMIN  (GLUCOPHAGE ) 500 MG tablet TAKE 1 TABLET BY MOUTH EVERY DAY WITH BREAKFAST 90 tablet 3   montelukast  (SINGULAIR ) 10 MG tablet TAKE 1 TABLET BY MOUTH EVERY DAY 90 tablet 3   omeprazole  (PRILOSEC) 40 MG capsule TAKE 1 CAPSULE (40 MG TOTAL) BY MOUTH DAILY. 90 capsule 3   pseudoephedrine  (SUDAFED) 120 MG 12 hr tablet Take 1 tablet (120 mg total) by mouth every 12 (twelve) hours as needed for congestion. 60 tablet 3   simvastatin  (ZOCOR ) 40 MG tablet TAKE 1 TABLET BY MOUTH EVERY DAY 90 tablet 2   levocetirizine (XYZAL ) 5 MG tablet Take 1 tablet (5 mg total) by mouth every evening. 90 tablet 3   No facility-administered medications prior to visit.    ROS: Review of Systems  Constitutional:  Negative for appetite change, fatigue and unexpected weight change.  HENT:  Negative for congestion, nosebleeds, sneezing, sore throat and trouble swallowing.   Eyes:  Negative for itching and visual disturbance.  Respiratory:  Negative for cough.   Cardiovascular:  Negative for chest pain, palpitations and leg swelling.   Gastrointestinal:  Negative for abdominal distention, blood in stool, diarrhea and nausea.  Genitourinary:  Negative for frequency and hematuria.  Musculoskeletal:  Negative for back pain, gait problem, joint swelling and neck pain.  Skin:  Negative for rash.  Neurological:  Negative for dizziness, tremors, speech difficulty and weakness.  Psychiatric/Behavioral:  Negative for agitation, dysphoric mood and sleep disturbance. The patient is not nervous/anxious.     Objective:  BP 118/82 (BP Location: Left Arm, Patient Position: Sitting, Cuff Size: Normal)   Pulse (!) 109   Temp 98 F (36.7 C) (Oral)   Ht 6' 2 (1.88 m)   Wt 203 lb (92.1 kg)   SpO2 96%   BMI 26.06 kg/m   BP Readings from Last 3 Encounters:  05/15/23 118/82  02/15/23 120/78  01/22/23 114/68    Wt Readings from Last 3 Encounters:  05/15/23 203 lb (92.1 kg)  02/15/23 200 lb (90.7 kg)  01/22/23 201 lb (91.2 kg)    Physical Exam Constitutional:      General: He is not in acute distress.    Appearance: He is well-developed.     Comments: NAD  Eyes:     Conjunctiva/sclera: Conjunctivae normal.     Pupils: Pupils are equal, round, and reactive to light.  Neck:     Thyroid : No thyromegaly.     Vascular: No JVD.  Cardiovascular:     Rate and Rhythm: Normal rate and  regular rhythm.     Heart sounds: Normal heart sounds. No murmur heard.    No friction rub. No gallop.  Pulmonary:     Effort: Pulmonary effort is normal. No respiratory distress.     Breath sounds: Normal breath sounds. No wheezing or rales.  Chest:     Chest wall: No tenderness.  Abdominal:     General: Bowel sounds are normal. There is no distension.     Palpations: Abdomen is soft. There is no mass.     Tenderness: There is no abdominal tenderness. There is no guarding or rebound.  Musculoskeletal:        General: No tenderness. Normal range of motion.     Cervical back: Normal range of motion.     Right lower leg: No edema.     Left  lower leg: No edema.  Lymphadenopathy:     Cervical: No cervical adenopathy.  Skin:    General: Skin is warm and dry.     Findings: No rash.  Neurological:     Mental Status: He is alert and oriented to person, place, and time.     Cranial Nerves: No cranial nerve deficit.     Motor: No abnormal muscle tone.     Coordination: Coordination normal.     Gait: Gait normal.     Deep Tendon Reflexes: Reflexes are normal and symmetric.  Psychiatric:        Behavior: Behavior normal.        Thought Content: Thought content normal.        Judgment: Judgment normal.     Lab Results  Component Value Date   WBC 6.4 06/12/2022   HGB 14.4 06/12/2022   HCT 42.2 06/12/2022   PLT 230.0 06/12/2022   GLUCOSE 99 02/15/2023   CHOL 165 06/12/2022   TRIG 155.0 (H) 06/12/2022   HDL 43.00 06/12/2022   LDLDIRECT 135.6 01/25/2012   LDLCALC 91 06/12/2022   ALT 21 02/15/2023   AST 18 02/15/2023   NA 138 02/15/2023   K 4.3 02/15/2023   CL 101 02/15/2023   CREATININE 0.97 02/15/2023   BUN 12 02/15/2023   CO2 30 02/15/2023   TSH 0.04 (L) 06/14/2022   PSA 0.63 06/12/2022   INR 0.9 RATIO 01/28/2007   HGBA1C 6.3 02/15/2023    CT CARDIAC SCORING Addendum Date: 02/07/2019 ADDENDUM REPORT: 02/07/2019 07:50 EXAM: OVER-READ INTERPRETATION  CT CHEST The following report is an over-read performed by radiologist Dr. Jackquline Skates [facility] on 02/07/2019. This over-read does not include interpretation of cardiac or coronary anatomy or pathology. The calcium score interpretation by the cardiologist is attached. COMPARISON:  None. FINDINGS: Limited view of the lung parenchyma demonstrates paraseptal emphysema in the LEFT lung. Airways are normal. Limited view of the mediastinum demonstrates no adenopathy. Esophagus normal. Limited view of the upper abdomen unremarkable. Limited view of the skeleton and chest wall is unremarkable. IMPRESSION: 1. Paraseptal emphysema. 2. No acute findings.  Electronically Signed   By: Jackquline Boxer M.D.   On: 02/07/2019 07:50   Result Date: 02/07/2019 CLINICAL DATA:  Risk stratification EXAM: Coronary Calcium Score MEDICATIONS: None TECHNIQUE: The patient was scanned on a [employer]. Axial non-contrast 3 mm slices were carried out through the heart. The data set was analyzed on a dedicated work station and scored using the Agatson method. FINDINGS: Non-cardiac: See separate report from [facility]. Ascending Aorta: Normal size, trivial calcifications. Pericardium: Normal. Coronary arteries: Normal size. IMPRESSION: Coronary calcium score  of 184. This was 80 percentile for age and sex matched control. Electronically Signed: By: Leim Moose On: 02/06/2019 19:00    Assessment & Plan:   Problem List Items Addressed This Visit     Hypothyroidism - Primary   Cont on Levothroid      LOW BACK PAIN   Doing well      OSA on CPAP   On CPAP  Cont on Nuvigyl       Type 2 diabetes mellitus with obesity (HCC)   [name] is off Rybelsus  due to GERD, constipation 7 wks ago, watching diet On a better diet      Relevant Orders   Comprehensive metabolic panel   Hemoglobin A1c      No orders of the defined types were placed in this encounter.     Follow-up: Return in about 4 months (around 09/12/2023) for Wellness Exam.  Marolyn Noel, MD

## 2023-05-15 NOTE — Assessment & Plan Note (Signed)
 Doing well

## 2023-05-15 NOTE — Assessment & Plan Note (Signed)
Cont on Levothroid 

## 2023-05-15 NOTE — Assessment & Plan Note (Signed)
 [name] [name] is off Rybelsus due to GERD, constipation 7 wks ago, watching diet On a better diet

## 2023-05-15 NOTE — Assessment & Plan Note (Signed)
On CPAP  Cont on [employer]

## 2023-05-20 ENCOUNTER — Encounter: Payer: Self-pay | Admitting: Internal Medicine

## 2023-06-27 ENCOUNTER — Other Ambulatory Visit: Payer: Self-pay | Admitting: Internal Medicine

## 2023-07-03 DIAGNOSIS — J101 Influenza due to other identified influenza virus with other respiratory manifestations: Secondary | ICD-10-CM | POA: Diagnosis not present

## 2023-08-15 LAB — HM DIABETES EYE EXAM

## 2023-09-07 ENCOUNTER — Encounter (HOSPITAL_COMMUNITY): Payer: Self-pay

## 2023-09-12 ENCOUNTER — Encounter: Payer: Self-pay | Admitting: Internal Medicine

## 2023-09-12 ENCOUNTER — Encounter: Payer: Federal, State, Local not specified - PPO | Admitting: Internal Medicine

## 2023-09-14 ENCOUNTER — Encounter: Payer: Self-pay | Admitting: Internal Medicine

## 2023-09-14 ENCOUNTER — Ambulatory Visit (INDEPENDENT_AMBULATORY_CARE_PROVIDER_SITE_OTHER): Admitting: Internal Medicine

## 2023-09-14 VITALS — BP 122/84 | HR 96 | Temp 97.8°F | Ht 74.0 in | Wt 208.2 lb

## 2023-09-14 DIAGNOSIS — Z6826 Body mass index (BMI) 26.0-26.9, adult: Secondary | ICD-10-CM | POA: Diagnosis not present

## 2023-09-14 DIAGNOSIS — E1169 Type 2 diabetes mellitus with other specified complication: Secondary | ICD-10-CM

## 2023-09-14 DIAGNOSIS — Z0000 Encounter for general adult medical examination without abnormal findings: Secondary | ICD-10-CM | POA: Diagnosis not present

## 2023-09-14 DIAGNOSIS — Z13220 Encounter for screening for lipoid disorders: Secondary | ICD-10-CM | POA: Diagnosis not present

## 2023-09-14 DIAGNOSIS — F4323 Adjustment disorder with mixed anxiety and depressed mood: Secondary | ICD-10-CM | POA: Diagnosis not present

## 2023-09-14 DIAGNOSIS — E669 Obesity, unspecified: Secondary | ICD-10-CM

## 2023-09-14 DIAGNOSIS — E034 Atrophy of thyroid (acquired): Secondary | ICD-10-CM | POA: Diagnosis not present

## 2023-09-14 DIAGNOSIS — K429 Umbilical hernia without obstruction or gangrene: Secondary | ICD-10-CM | POA: Insufficient documentation

## 2023-09-14 DIAGNOSIS — Z1211 Encounter for screening for malignant neoplasm of colon: Secondary | ICD-10-CM

## 2023-09-14 LAB — COMPREHENSIVE METABOLIC PANEL WITH GFR
ALT: 17 U/L (ref 0–53)
AST: 13 U/L (ref 0–37)
Albumin: 4.2 g/dL (ref 3.5–5.2)
Alkaline Phosphatase: 65 U/L (ref 39–117)
BUN: 12 mg/dL (ref 6–23)
CO2: 29 meq/L (ref 19–32)
Calcium: 9.2 mg/dL (ref 8.4–10.5)
Chloride: 103 meq/L (ref 96–112)
Creatinine, Ser: 1.03 mg/dL (ref 0.40–1.50)
GFR: 78.52 mL/min (ref >60.00)
Glucose, Bld: 97 mg/dL (ref 70–99)
Potassium: 4.1 meq/L (ref 3.5–5.1)
Sodium: 140 meq/L (ref 135–145)
Total Bilirubin: 0.4 mg/dL (ref 0.2–1.2)
Total Protein: 6.3 g/dL (ref 6.0–8.3)

## 2023-09-14 LAB — MICROALBUMIN / CREATININE URINE RATIO
Creatinine,U: 62.1 mg/dL
Microalb Creat Ratio: UNDETERMINED mg/g (ref 0.0–30.0)
Microalb, Ur: <0.7 mg/dL

## 2023-09-14 LAB — CBC WITH DIFFERENTIAL/PLATELET
Basophils Absolute: 0.1 10*3/uL (ref 0.0–0.1)
Basophils Relative: 0.9 % (ref 0.0–3.0)
Eosinophils Absolute: 0.1 10*3/uL (ref 0.0–0.7)
Eosinophils Relative: 1.8 % (ref 0.0–5.0)
HCT: 43.9 % (ref 39.0–52.0)
Hemoglobin: 14.5 g/dL (ref 13.0–17.0)
Lymphocytes Relative: 19.3 % (ref 12.0–46.0)
Lymphs Abs: 1.3 10*3/uL (ref 0.7–4.0)
MCHC: 33.1 g/dL (ref 30.0–36.0)
MCV: 89.6 fl (ref 78.0–100.0)
Monocytes Absolute: 0.7 10*3/uL (ref 0.1–1.0)
Monocytes Relative: 9.9 % (ref 3.0–12.0)
Neutro Abs: 4.5 10*3/uL (ref 1.4–7.7)
Neutrophils Relative %: 68.1 % (ref 43.0–77.0)
Platelets: 214 10*3/uL (ref 150.0–400.0)
RBC: 4.9 Mil/uL (ref 4.22–5.81)
RDW: 14.5 % (ref 11.5–15.5)
WBC: 6.6 10*3/uL (ref 4.0–10.5)

## 2023-09-14 LAB — LIPID PANEL
Cholesterol: 187 mg/dL (ref 0–200)
HDL: 42.6 mg/dL (ref >39.00)
LDL Cholesterol: 116 mg/dL — ABNORMAL HIGH (ref 0–99)
NonHDL: 144.75
Total CHOL/HDL Ratio: 4
Triglycerides: 143 mg/dL (ref 0.0–149.0)
VLDL: 28.6 mg/dL (ref 0.0–40.0)

## 2023-09-14 LAB — URINALYSIS
Bilirubin Urine: NEGATIVE
Ketones, ur: NEGATIVE
Leukocytes,Ua: NEGATIVE
Nitrite: NEGATIVE
Specific Gravity, Urine: 1.01 (ref 1.000–1.030)
Total Protein, Urine: NEGATIVE
Urine Glucose: NEGATIVE
Urobilinogen, UA: 0.2 (ref 0.0–1.0)
pH: 7 (ref 5.0–8.0)

## 2023-09-14 LAB — HEMOGLOBIN A1C: Hgb A1c MFr Bld: 6.3 % (ref 4.6–6.5)

## 2023-09-14 NOTE — Progress Notes (Signed)
 Subjective:  Patient ID: [name] [name], male    DOB: 1960/06/08  Age: 63 y.o. MRN: [id number]  CC: Annual Exam (Annual Exam. Patient notes having some flu-like symptoms starting Sunday. Fatigue, sweats starting this morning)   HPI [name] [name] presents for a well exam  Outpatient Medications Prior to Visit  Medication Sig Dispense Refill   Albuterol  Sulfate (PROAIR  RESPICLICK) 108 (90 Base) MCG/ACT AEPB Inhale 1-2 puffs into the lungs 4 (four) times daily as needed. 1 each 5   Armodafinil  150 MG tablet TAKE 1 TABLET BY MOUTH EVERY DAY IN THE MORNING 30 tablet 3   Azelastine -Fluticasone  (DYMISTA ) 137-50 MCG/ACT SUSP Place 1 Act into the nose 1 day or 1 dose. 23 g 5   cholecalciferol (VITAMIN D) 1000 UNITS tablet Take 1,000 Units by mouth daily.     levothyroxine  (SYNTHROID ) 150 MCG tablet TAKE 1 TABLET BY MOUTH EVERY DAY 90 tablet 2   metFORMIN  (GLUCOPHAGE ) 500 MG tablet TAKE 1 TABLET BY MOUTH EVERY DAY WITH BREAKFAST 90 tablet 3   montelukast  (SINGULAIR ) 10 MG tablet TAKE 1 TABLET BY MOUTH EVERY DAY 90 tablet 3   omeprazole  (PRILOSEC) 40 MG capsule TAKE 1 CAPSULE (40 MG TOTAL) BY MOUTH DAILY. 90 capsule 3   pseudoephedrine  (SUDAFED) 120 MG 12 hr tablet Take 1 tablet (120 mg total) by mouth every 12 (twelve) hours as needed for congestion. 60 tablet 3   simvastatin  (ZOCOR ) 40 MG tablet TAKE 1 TABLET BY MOUTH EVERY DAY 90 tablet 2   No facility-administered medications prior to visit.    ROS: Review of Systems  Constitutional:  Positive for unexpected weight change. Negative for appetite change and fatigue.  HENT:  Negative for congestion, nosebleeds, sneezing, sore throat and trouble swallowing.   Eyes:  Negative for itching and visual disturbance.  Respiratory:  Positive for wheezing. Negative for cough and shortness of breath.   Cardiovascular:  Negative for chest pain, palpitations and leg swelling.  Gastrointestinal:  Negative for abdominal distention, blood in stool,  diarrhea and nausea.  Genitourinary:  Negative for frequency and hematuria.  Musculoskeletal:  Negative for back pain, gait problem, joint swelling and neck pain.  Skin:  Negative for rash.  Neurological:  Negative for dizziness, tremors, speech difficulty and weakness.  Psychiatric/Behavioral:  Negative for agitation, dysphoric mood and sleep disturbance. The patient is not nervous/anxious.     Objective:  BP 122/84   Pulse 96   Temp 97.8 F (36.6 C)   Ht 6\' 2"  (1.88 m)   Wt 208 lb 3.2 oz (94.4 kg)   SpO2 98%   BMI 26.73 kg/m   BP Readings from Last 3 Encounters:  09/14/23 122/84  05/15/23 118/82  02/15/23 120/78    Wt Readings from Last 3 Encounters:  09/14/23 208 lb 3.2 oz (94.4 kg)  05/15/23 203 lb (92.1 kg)  02/15/23 200 lb (90.7 kg)    Physical Exam Constitutional:      General: He is not in acute distress.    Appearance: He is well-developed.     Comments: NAD  Eyes:     Conjunctiva/sclera: Conjunctivae normal.     Pupils: Pupils are equal, round, and reactive to light.  Neck:     Thyroid : No thyromegaly.     Vascular: No JVD.  Cardiovascular:     Rate and Rhythm: Normal rate and regular rhythm.     Heart sounds: Normal heart sounds. No murmur heard.    No friction rub. No gallop.  Pulmonary:     Effort: Pulmonary effort is normal. No respiratory distress.     Breath sounds: Normal breath sounds. No wheezing or rales.  Chest:     Chest wall: No tenderness.  Abdominal:     General: Bowel sounds are normal. There is no distension.     Palpations: Abdomen is soft. There is no mass.     Tenderness: There is no abdominal tenderness. There is no guarding or rebound.  Musculoskeletal:        General: No tenderness. Normal range of motion.     Cervical back: Normal range of motion.  Lymphadenopathy:     Cervical: No cervical adenopathy.  Skin:    General: Skin is warm and dry.     Findings: No rash.  Neurological:     Mental Status: He is alert and  oriented to person, place, and time.     Cranial Nerves: No cranial nerve deficit.     Motor: No abnormal muscle tone.     Coordination: Coordination normal.     Gait: Gait normal.     Deep Tendon Reflexes: Reflexes are normal and symmetric.  Psychiatric:        Behavior: Behavior normal.        Thought Content: Thought content normal.        Judgment: Judgment normal.   Umbilical hernia - NT  Lab Results  Component Value Date   WBC 6.4 06/12/2022   HGB 14.4 06/12/2022   HCT 42.2 06/12/2022   PLT 230.0 06/12/2022   GLUCOSE 98 05/15/2023   CHOL 165 06/12/2022   TRIG 155.0 (H) 06/12/2022   HDL 43.00 06/12/2022   LDLDIRECT 135.6 01/25/2012   LDLCALC 91 06/12/2022   ALT 18 05/15/2023   AST 15 05/15/2023   NA 138 05/15/2023   K 4.0 05/15/2023   CL 101 05/15/2023   CREATININE 1.12 05/15/2023   BUN 11 05/15/2023   CO2 29 05/15/2023   TSH 0.04 (L) 06/14/2022   PSA 0.63 06/12/2022   INR 0.9 RATIO 01/28/2007   HGBA1C 6.6 (H) 05/15/2023    CT CARDIAC SCORING Addendum Date: 02/07/2019 ADDENDUM REPORT: 02/07/2019 07:50 EXAM: OVER-READ INTERPRETATION  CT CHEST The following report is an over-read performed by radiologist Dr. Pearlean Botts [facility] on 02/07/2019. This over-read does not include interpretation of cardiac or coronary anatomy or pathology. The calcium score interpretation by the cardiologist is attached. COMPARISON:  None. FINDINGS: Limited view of the lung parenchyma demonstrates paraseptal emphysema in the LEFT lung. Airways are normal. Limited view of the mediastinum demonstrates no adenopathy. Esophagus normal. Limited view of the upper abdomen unremarkable. Limited view of the skeleton and chest wall is unremarkable. IMPRESSION: 1. Paraseptal emphysema. 2. No acute findings. Electronically Signed   By: Deboraha Fallow M.D.   On: 02/07/2019 07:50   Result Date: 02/07/2019 CLINICAL DATA:  Risk stratification EXAM: Coronary Calcium Score MEDICATIONS:  None TECHNIQUE: The patient was scanned on a [employer]. Axial non-contrast 3 mm slices were carried out through the heart. The data set was analyzed on a dedicated work station and scored using the Agatson method. FINDINGS: Non-cardiac: See separate report from [facility]. Ascending Aorta: Normal size, trivial calcifications. Pericardium: Normal. Coronary arteries: Normal size. IMPRESSION: Coronary calcium score of 184. This was 58 percentile for age and sex matched control. Electronically Signed: By: Christoper Crafts On: 02/06/2019 19:00    Assessment & Plan:   Problem List Items Addressed This Visit  Hypothyroidism   Cont on Levothroid      Adjustment disorder with mixed anxiety and depressed mood   Off Effexor , doing well      Well adult exam - Primary    We discussed age appropriate health related issues, including available/recomended screening tests and vaccinations. Labs were ordered to be later reviewed . All questions were answered. We discussed one or more of the following - seat belt use, use of sunscreen/sun exposure exercise, safe sex, fall risk reduction, second hand smoke exposure, firearm use and storage, seat belt use, a need for adhering to healthy diet and exercise. Labs were ordered.  All questions were answered. Cologuard due Try to loose wt      Relevant Orders   TSH   Urinalysis   CBC with Differential/Platelet   Lipid panel   PSA   Comprehensive metabolic panel with GFR   Hemoglobin A1c   Microalbumin / creatinine urine ratio   Cologuard   Type 2 diabetes mellitus with obesity (HCC)    On a better diet      Relevant Orders   Hemoglobin A1c   Microalbumin / creatinine urine ratio   Umbilical hernia   Umbilical hernia - NT Options discussed      Other Visit Diagnoses       Screening for colon cancer       Relevant Orders   Cologuard         No orders of the defined types were placed in this encounter.      Follow-up: Return in about 6 months (around 03/16/2024) for a follow-up visit.  Anitra Barn, MD

## 2023-09-14 NOTE — Assessment & Plan Note (Signed)
  We discussed age appropriate health related issues, including available/recomended screening tests and vaccinations. Labs were ordered to be later reviewed . All questions were answered. We discussed one or more of the following - seat belt use, use of sunscreen/sun exposure exercise, safe sex, fall risk reduction, second hand smoke exposure, firearm use and storage, seat belt use, a need for adhering to healthy diet and exercise. Labs were ordered.  All questions were answered. Cologuard due Try to loose wt

## 2023-09-14 NOTE — Assessment & Plan Note (Addendum)
On a better diet

## 2023-09-14 NOTE — Assessment & Plan Note (Signed)
Cont on Levothroid 

## 2023-09-14 NOTE — Assessment & Plan Note (Addendum)
 Umbilical hernia - NT Options discussed

## 2023-09-14 NOTE — Assessment & Plan Note (Signed)
 Off Effexor , doing well

## 2023-09-18 LAB — TSH: TSH: 0.13 u[IU]/mL — ABNORMAL LOW (ref 0.35–5.50)

## 2023-09-18 LAB — PSA: PSA: 1.01 ng/mL (ref 0.10–4.00)

## 2023-09-21 ENCOUNTER — Ambulatory Visit: Payer: Self-pay | Admitting: Internal Medicine

## 2023-09-21 DIAGNOSIS — R195 Other fecal abnormalities: Secondary | ICD-10-CM

## 2023-09-26 ENCOUNTER — Encounter: Payer: Self-pay | Admitting: Internal Medicine

## 2023-10-05 ENCOUNTER — Other Ambulatory Visit: Payer: Self-pay | Admitting: Internal Medicine

## 2023-10-05 DIAGNOSIS — K429 Umbilical hernia without obstruction or gangrene: Secondary | ICD-10-CM

## 2023-10-13 LAB — COLOGUARD: COLOGUARD: POSITIVE — AB

## 2023-10-15 DIAGNOSIS — R195 Other fecal abnormalities: Secondary | ICD-10-CM | POA: Insufficient documentation

## 2023-10-17 DIAGNOSIS — K429 Umbilical hernia without obstruction or gangrene: Secondary | ICD-10-CM | POA: Diagnosis not present

## 2023-10-17 DIAGNOSIS — Z1330 Encounter for screening examination for mental health and behavioral disorders, unspecified: Secondary | ICD-10-CM | POA: Diagnosis not present

## 2023-10-30 DIAGNOSIS — D122 Benign neoplasm of ascending colon: Secondary | ICD-10-CM | POA: Diagnosis not present

## 2023-10-30 DIAGNOSIS — D128 Benign neoplasm of rectum: Secondary | ICD-10-CM | POA: Diagnosis not present

## 2023-10-30 DIAGNOSIS — Z1211 Encounter for screening for malignant neoplasm of colon: Secondary | ICD-10-CM | POA: Diagnosis not present

## 2023-10-30 DIAGNOSIS — D125 Benign neoplasm of sigmoid colon: Secondary | ICD-10-CM | POA: Diagnosis not present

## 2023-10-30 DIAGNOSIS — R195 Other fecal abnormalities: Secondary | ICD-10-CM | POA: Diagnosis not present

## 2023-11-19 ENCOUNTER — Other Ambulatory Visit: Payer: Self-pay

## 2023-11-19 ENCOUNTER — Encounter: Payer: Self-pay | Admitting: Internal Medicine

## 2023-11-19 MED ORDER — PROAIR RESPICLICK 108 (90 BASE) MCG/ACT IN AEPB: 1.0000 | INHALATION_SPRAY | Freq: Four times a day (QID) | RESPIRATORY_TRACT | 5 refills | Status: AC | PRN

## 2023-11-21 DIAGNOSIS — J449 Chronic obstructive pulmonary disease, unspecified: Secondary | ICD-10-CM | POA: Diagnosis not present

## 2023-11-21 DIAGNOSIS — Z79899 Other long term (current) drug therapy: Secondary | ICD-10-CM | POA: Diagnosis not present

## 2023-11-21 DIAGNOSIS — E119 Type 2 diabetes mellitus without complications: Secondary | ICD-10-CM | POA: Diagnosis not present

## 2023-11-21 DIAGNOSIS — F17210 Nicotine dependence, cigarettes, uncomplicated: Secondary | ICD-10-CM | POA: Diagnosis not present

## 2023-11-21 DIAGNOSIS — K420 Umbilical hernia with obstruction, without gangrene: Secondary | ICD-10-CM | POA: Diagnosis not present

## 2023-11-21 DIAGNOSIS — G4733 Obstructive sleep apnea (adult) (pediatric): Secondary | ICD-10-CM | POA: Diagnosis not present

## 2023-11-21 DIAGNOSIS — K429 Umbilical hernia without obstruction or gangrene: Secondary | ICD-10-CM | POA: Diagnosis not present

## 2023-12-12 DIAGNOSIS — E663 Overweight: Secondary | ICD-10-CM | POA: Diagnosis not present

## 2023-12-12 DIAGNOSIS — Z09 Encounter for follow-up examination after completed treatment for conditions other than malignant neoplasm: Secondary | ICD-10-CM | POA: Diagnosis not present

## 2024-01-26 DIAGNOSIS — G4733 Obstructive sleep apnea (adult) (pediatric): Secondary | ICD-10-CM | POA: Diagnosis not present

## 2024-01-26 DIAGNOSIS — R0602 Shortness of breath: Secondary | ICD-10-CM | POA: Diagnosis not present

## 2024-01-26 DIAGNOSIS — J441 Chronic obstructive pulmonary disease with (acute) exacerbation: Secondary | ICD-10-CM | POA: Diagnosis not present

## 2024-02-07 ENCOUNTER — Other Ambulatory Visit: Payer: Self-pay | Admitting: Internal Medicine

## 2024-02-07 DIAGNOSIS — G4733 Obstructive sleep apnea (adult) (pediatric): Secondary | ICD-10-CM | POA: Diagnosis not present

## 2024-02-07 DIAGNOSIS — J439 Emphysema, unspecified: Secondary | ICD-10-CM | POA: Diagnosis not present

## 2024-02-07 DIAGNOSIS — R1310 Dysphagia, unspecified: Secondary | ICD-10-CM | POA: Diagnosis not present

## 2024-02-07 DIAGNOSIS — F17210 Nicotine dependence, cigarettes, uncomplicated: Secondary | ICD-10-CM | POA: Diagnosis not present

## 2024-02-07 DIAGNOSIS — E785 Hyperlipidemia, unspecified: Secondary | ICD-10-CM

## 2024-02-07 DIAGNOSIS — J984 Other disorders of lung: Secondary | ICD-10-CM | POA: Diagnosis not present

## 2024-03-01 ENCOUNTER — Other Ambulatory Visit: Payer: Self-pay | Admitting: Internal Medicine

## 2024-03-02 ENCOUNTER — Other Ambulatory Visit: Payer: Self-pay | Admitting: Internal Medicine

## 2024-03-17 ENCOUNTER — Ambulatory Visit: Admitting: Internal Medicine

## 2024-03-17 ENCOUNTER — Encounter: Payer: Self-pay | Admitting: Internal Medicine

## 2024-03-17 VITALS — BP 136/84 | HR 92 | Temp 97.7°F | Ht 74.0 in | Wt 213.6 lb

## 2024-03-17 DIAGNOSIS — J441 Chronic obstructive pulmonary disease with (acute) exacerbation: Secondary | ICD-10-CM

## 2024-03-17 DIAGNOSIS — R7309 Other abnormal glucose: Secondary | ICD-10-CM

## 2024-03-17 DIAGNOSIS — J439 Emphysema, unspecified: Secondary | ICD-10-CM

## 2024-03-17 DIAGNOSIS — G4733 Obstructive sleep apnea (adult) (pediatric): Secondary | ICD-10-CM

## 2024-03-17 DIAGNOSIS — F4323 Adjustment disorder with mixed anxiety and depressed mood: Secondary | ICD-10-CM | POA: Diagnosis not present

## 2024-03-17 DIAGNOSIS — E034 Atrophy of thyroid (acquired): Secondary | ICD-10-CM | POA: Diagnosis not present

## 2024-03-17 DIAGNOSIS — Z23 Encounter for immunization: Secondary | ICD-10-CM

## 2024-03-17 LAB — CBC WITH DIFFERENTIAL/PLATELET
Basophils Absolute: 0.1 K/uL (ref 0.0–0.1)
Basophils Relative: 1.1 % (ref 0.0–3.0)
Eosinophils Absolute: 0.1 K/uL (ref 0.0–0.7)
Eosinophils Relative: 1.8 % (ref 0.0–5.0)
HCT: 44.9 % (ref 39.0–52.0)
Hemoglobin: 15 g/dL (ref 13.0–17.0)
Lymphocytes Relative: 20 % (ref 12.0–46.0)
Lymphs Abs: 1.3 K/uL (ref 0.7–4.0)
MCHC: 33.3 g/dL (ref 30.0–36.0)
MCV: 90.7 fl (ref 78.0–100.0)
Monocytes Absolute: 0.7 K/uL (ref 0.1–1.0)
Monocytes Relative: 10.8 % (ref 3.0–12.0)
Neutro Abs: 4.2 K/uL (ref 1.4–7.7)
Neutrophils Relative %: 66.3 % (ref 43.0–77.0)
Platelets: 218 K/uL (ref 150.0–400.0)
RBC: 4.95 Mil/uL (ref 4.22–5.81)
RDW: 14.5 % (ref 11.5–15.5)
WBC: 6.3 K/uL (ref 4.0–10.5)

## 2024-03-17 LAB — COMPREHENSIVE METABOLIC PANEL WITH GFR
ALT: 22 U/L (ref 0–53)
AST: 20 U/L (ref 0–37)
Albumin: 4.4 g/dL (ref 3.5–5.2)
Alkaline Phosphatase: 67 U/L (ref 39–117)
BUN: 10 mg/dL (ref 6–23)
CO2: 26 meq/L (ref 19–32)
Calcium: 9.6 mg/dL (ref 8.4–10.5)
Chloride: 101 meq/L (ref 96–112)
Creatinine, Ser: 0.97 mg/dL (ref 0.40–1.50)
GFR: 84.08 mL/min (ref >60.00)
Glucose, Bld: 93 mg/dL (ref 70–99)
Potassium: 4 meq/L (ref 3.5–5.1)
Sodium: 138 meq/L (ref 135–145)
Total Bilirubin: 0.4 mg/dL (ref 0.2–1.2)
Total Protein: 7 g/dL (ref 6.0–8.3)

## 2024-03-17 LAB — TSH: TSH: 0.42 u[IU]/mL (ref 0.35–5.50)

## 2024-03-17 LAB — T4, FREE: Free T4: 1.11 ng/dL (ref 0.60–1.60)

## 2024-03-17 NOTE — Addendum Note (Signed)
 Addended byBETHA LUCETTA CLEATRICE LELON on: 03/17/2024 09:52 AM   Modules accepted: Orders

## 2024-03-17 NOTE — Assessment & Plan Note (Signed)
Cont on Levothroid 

## 2024-03-17 NOTE — Assessment & Plan Note (Signed)
 On Breztri   [name] [name] is seeing Dr Nathanael (Pulm) in [place] - chest CT, sleep test are scheduled

## 2024-03-17 NOTE — Progress Notes (Signed)
 Subjective:  Patient ID: [name] [name], male    DOB: 01/01/1961  Age: 63 y.o. MRN: [id number]  CC: Medical Management of Chronic Issues (6 Month follow up)   HPI [name] [name] presents for hernia surgery 10/2023, COPD exacerbation 12/2023 [name] [name] is seeing [name] [name] (Pulm) - CT, sleep test are scheduled   Outpatient Medications Prior to Visit  Medication Sig Dispense Refill   Albuterol  Sulfate (PROAIR  RESPICLICK) 108 (90 Base) MCG/ACT AEPB Inhale 1-2 puffs into the lungs 4 (four) times daily as needed. 1 each 5   Armodafinil  150 MG tablet TAKE 1 TABLET BY MOUTH EVERY DAY IN THE MORNING 30 tablet 3   Azelastine -Fluticasone  (DYMISTA ) 137-50 MCG/ACT SUSP Place 1 Act into the nose 1 day or 1 dose. 23 g 5   BREZTRI  AEROSPHERE 160-9-4.8 MCG/ACT AERO inhaler Inhale 2 puffs into the lungs daily.     cholecalciferol (VITAMIN D) 1000 UNITS tablet Take 1,000 Units by mouth daily.     levothyroxine  (SYNTHROID ) 150 MCG tablet TAKE 1 TABLET BY MOUTH EVERY DAY 90 tablet 2   metFORMIN  (GLUCOPHAGE ) 500 MG tablet TAKE 1 TABLET BY MOUTH EVERY DAY WITH BREAKFAST 90 tablet 3   montelukast  (SINGULAIR ) 10 MG tablet TAKE 1 TABLET BY MOUTH EVERY DAY 90 tablet 3   omeprazole  (PRILOSEC) 40 MG capsule TAKE 1 CAPSULE (40 MG TOTAL) BY MOUTH DAILY. 90 capsule 3   simvastatin  (ZOCOR ) 40 MG tablet TAKE 1 TABLET BY MOUTH EVERY DAY 90 tablet 2   No facility-administered medications prior to visit.    ROS: Review of Systems  Constitutional:  Positive for fatigue. Negative for appetite change and unexpected weight change.  HENT:  Negative for congestion, nosebleeds, sneezing, sore throat and trouble swallowing.   Eyes:  Negative for itching and visual disturbance.  Respiratory:  Positive for cough and shortness of breath.   Cardiovascular:  Negative for chest pain, palpitations and leg swelling.  Gastrointestinal:  Negative for abdominal distention, blood in stool, diarrhea and nausea.  Genitourinary:  Negative for  frequency and hematuria.  Musculoskeletal:  Negative for back pain, gait problem, joint swelling and neck pain.  Skin:  Negative for rash.  Neurological:  Negative for dizziness, tremors, speech difficulty and weakness.  Psychiatric/Behavioral:  Negative for agitation, dysphoric mood and sleep disturbance. The patient is not nervous/anxious.     Objective:  BP 136/84   Pulse 92   Temp 97.7 F (36.5 C)   Ht 6' 2 (1.88 m)   Wt 213 lb 9.6 oz (96.9 kg)   SpO2 93%   BMI 27.42 kg/m   BP Readings from Last 3 Encounters:  03/17/24 136/84  09/14/23 122/84  05/15/23 118/82    Wt Readings from Last 3 Encounters:  03/17/24 213 lb 9.6 oz (96.9 kg)  09/14/23 208 lb 3.2 oz (94.4 kg)  05/15/23 203 lb (92.1 kg)    Physical Exam Constitutional:      General: He is not in acute distress.    Appearance: He is well-developed. He is obese.     Comments: NAD  Eyes:     Conjunctiva/sclera: Conjunctivae normal.     Pupils: Pupils are equal, round, and reactive to light.  Neck:     Thyroid : No thyromegaly.     Vascular: No JVD.  Cardiovascular:     Rate and Rhythm: Normal rate and regular rhythm.     Heart sounds: Normal heart sounds. No murmur heard.    No friction rub. No gallop.  Pulmonary:  Effort: Pulmonary effort is normal. No respiratory distress.     Breath sounds: Normal breath sounds. No wheezing or rales.  Chest:     Chest wall: No tenderness.  Abdominal:     General: Bowel sounds are normal. There is no distension.     Palpations: Abdomen is soft. There is no mass.     Tenderness: There is no abdominal tenderness. There is no guarding or rebound.  Musculoskeletal:        General: No tenderness. Normal range of motion.     Cervical back: Normal range of motion.  Lymphadenopathy:     Cervical: No cervical adenopathy.  Skin:    General: Skin is warm and dry.     Findings: No rash.  Neurological:     Mental Status: He is alert and oriented to person, place, and time.      Cranial Nerves: No cranial nerve deficit.     Motor: No abnormal muscle tone.     Coordination: Coordination normal.     Gait: Gait normal.     Deep Tendon Reflexes: Reflexes are normal and symmetric.  Psychiatric:        Behavior: Behavior normal.        Thought Content: Thought content normal.        Judgment: Judgment normal.     Lab Results  Component Value Date   WBC 6.6 09/14/2023   HGB 14.5 09/14/2023   HCT 43.9 09/14/2023   PLT 214.0 09/14/2023   GLUCOSE 97 09/14/2023   CHOL 187 09/14/2023   TRIG 143.0 09/14/2023   HDL 42.60 09/14/2023   LDLDIRECT 135.6 01/25/2012   LDLCALC 116 (H) 09/14/2023   ALT 17 09/14/2023   AST 13 09/14/2023   NA 140 09/14/2023   K 4.1 09/14/2023   CL 103 09/14/2023   CREATININE 1.03 09/14/2023   BUN 12 09/14/2023   CO2 29 09/14/2023   TSH 0.13 (L) 09/14/2023   PSA 1.01 09/14/2023   INR 0.9 RATIO 01/28/2007   HGBA1C 6.3 09/14/2023   MICROALBUR <0.7 09/14/2023    CT CARDIAC SCORING Addendum Date: 02/07/2019 ADDENDUM REPORT: 02/07/2019 07:50 EXAM: OVER-READ INTERPRETATION  CT CHEST The following report is an over-read performed by radiologist [name]. Jackquline Skates [facility] on 02/07/2019. This over-read does not include interpretation of cardiac or coronary anatomy or pathology. The calcium score interpretation by the cardiologist is attached. COMPARISON:  None. FINDINGS: Limited view of the lung parenchyma demonstrates paraseptal emphysema in the LEFT lung. Airways are normal. Limited view of the mediastinum demonstrates no adenopathy. Esophagus normal. Limited view of the upper abdomen unremarkable. Limited view of the skeleton and chest wall is unremarkable. IMPRESSION: 1. Paraseptal emphysema. 2. No acute findings. Electronically Signed   By: Jackquline Boxer M.D.   On: 02/07/2019 07:50   Result Date: 02/07/2019 CLINICAL DATA:  Risk stratification EXAM: Coronary Calcium Score MEDICATIONS: None TECHNIQUE: The patient was  scanned on a [employer]. Axial non-contrast 3 mm slices were carried out through the heart. The data set was analyzed on a dedicated work station and scored using the Agatson method. FINDINGS: Non-cardiac: See separate report from [facility]. Ascending Aorta: Normal size, trivial calcifications. Pericardium: Normal. Coronary arteries: Normal size. IMPRESSION: Coronary calcium score of 184. This was 43 percentile for age and sex matched control. Electronically Signed: By: Leim Moose On: 02/06/2019 19:00    Assessment & Plan:   Problem List Items Addressed This Visit     Adjustment disorder  with mixed anxiety and depressed mood   Off Effexor , doing well      Relevant Orders   Comprehensive metabolic panel with GFR   CBC with Differential/Platelet   Hemoglobin A1c   TSH   T4, free   COPD exacerbation (HCC) - Primary   On Breztri   [name] [name] is seeing [name] [name] (Pulm) in [place] - chest CT, sleep test are scheduled      Relevant Medications   BREZTRI  AEROSPHERE 160-9-4.8 MCG/ACT AERO inhaler   Elevated glucose   Relevant Orders   Hemoglobin A1c   EMPHYSEMA, BULLOUS   On Breztri   [name] [name] is seeing [name] [name] ([facility]ulm) in [place] - chest CT, sleep test are scheduled      Relevant Medications   BREZTRI  AEROSPHERE 160-9-4.8 MCG/ACT AERO inhaler   Hypothyroidism   Cont on Levothroid      Relevant Orders   Comprehensive metabolic panel with GFR   CBC with Differential/Platelet   Hemoglobin A1c   TSH   T4, free   OSA on CPAP    [name] [name] is seeing [name] [name] (Pulm) in [place] - chest CT, sleep test are scheduled         No orders of the defined types were placed in this encounter.     Follow-up: Return in about 3 months (around 06/17/2024) for a follow-up visit.  Marolyn Noel, MD

## 2024-03-17 NOTE — Assessment & Plan Note (Signed)
 Off Effexor , doing well

## 2024-03-17 NOTE — Assessment & Plan Note (Signed)
  [name] [name] is seeing Dr Nathanael ([facility]ul[facility]) in [place] - chest CT, sleep test are scheduled

## 2024-03-18 LAB — HEMOGLOBIN A1C: Hgb A1c MFr Bld: 6.6 % — ABNORMAL HIGH (ref 4.6–6.5)

## 2024-03-21 ENCOUNTER — Other Ambulatory Visit: Payer: Self-pay | Admitting: Internal Medicine

## 2024-03-22 ENCOUNTER — Ambulatory Visit: Payer: Self-pay | Admitting: Internal Medicine

## 2024-04-08 DIAGNOSIS — Z122 Encounter for screening for malignant neoplasm of respiratory organs: Secondary | ICD-10-CM | POA: Diagnosis not present

## 2024-04-08 DIAGNOSIS — F17210 Nicotine dependence, cigarettes, uncomplicated: Secondary | ICD-10-CM | POA: Diagnosis not present

## 2024-04-08 DIAGNOSIS — J439 Emphysema, unspecified: Secondary | ICD-10-CM | POA: Diagnosis not present

## 2024-04-08 DIAGNOSIS — Z720 Tobacco use: Secondary | ICD-10-CM | POA: Diagnosis not present

## 2024-04-09 DIAGNOSIS — G4733 Obstructive sleep apnea (adult) (pediatric): Secondary | ICD-10-CM | POA: Diagnosis not present

## 2024-04-10 DIAGNOSIS — G4733 Obstructive sleep apnea (adult) (pediatric): Secondary | ICD-10-CM | POA: Diagnosis not present

## 2024-04-15 DIAGNOSIS — J439 Emphysema, unspecified: Secondary | ICD-10-CM | POA: Diagnosis not present

## 2024-04-15 DIAGNOSIS — R1310 Dysphagia, unspecified: Secondary | ICD-10-CM | POA: Diagnosis not present

## 2024-04-15 DIAGNOSIS — G4733 Obstructive sleep apnea (adult) (pediatric): Secondary | ICD-10-CM | POA: Diagnosis not present

## 2024-04-15 DIAGNOSIS — Z720 Tobacco use: Secondary | ICD-10-CM | POA: Diagnosis not present

## 2024-04-25 DIAGNOSIS — G4733 Obstructive sleep apnea (adult) (pediatric): Secondary | ICD-10-CM | POA: Diagnosis not present

## 2024-05-10 ENCOUNTER — Other Ambulatory Visit: Payer: Self-pay | Admitting: Internal Medicine

## 2024-06-17 ENCOUNTER — Ambulatory Visit: Admitting: Internal Medicine
